# Patient Record
Sex: Female | Born: 1935 | Race: Black or African American | Hispanic: No | Marital: Married | Smoking: Former smoker
Health system: Southern US, Community
[De-identification: ages and names within clinical notes are randomized; demographics above are authoritative.]

## PROBLEM LIST (undated history)

## (undated) DIAGNOSIS — E119 Type 2 diabetes mellitus without complications: Secondary | ICD-10-CM

## (undated) DIAGNOSIS — I2510 Atherosclerotic heart disease of native coronary artery without angina pectoris: Secondary | ICD-10-CM

## (undated) DIAGNOSIS — N189 Chronic kidney disease, unspecified: Secondary | ICD-10-CM

## (undated) DIAGNOSIS — I10 Essential (primary) hypertension: Secondary | ICD-10-CM

## (undated) DIAGNOSIS — M1990 Unspecified osteoarthritis, unspecified site: Secondary | ICD-10-CM

## (undated) DIAGNOSIS — D649 Anemia, unspecified: Secondary | ICD-10-CM

## (undated) DIAGNOSIS — I739 Peripheral vascular disease, unspecified: Secondary | ICD-10-CM

## (undated) DIAGNOSIS — I8390 Asymptomatic varicose veins of unspecified lower extremity: Secondary | ICD-10-CM

## (undated) DIAGNOSIS — I499 Cardiac arrhythmia, unspecified: Secondary | ICD-10-CM

## (undated) DIAGNOSIS — E785 Hyperlipidemia, unspecified: Secondary | ICD-10-CM

## (undated) HISTORY — PX: OTHER SURGICAL HISTORY: SHX169

## (undated) HISTORY — PX: ABDOMINAL HYSTERECTOMY: SHX81

## (undated) HISTORY — PX: APPENDECTOMY: SHX54

---

## 2007-06-10 ENCOUNTER — Ambulatory Visit: Payer: Self-pay | Admitting: Family Medicine

## 2007-06-25 ENCOUNTER — Ambulatory Visit: Payer: Self-pay | Admitting: Family Medicine

## 2007-07-03 ENCOUNTER — Emergency Department: Payer: Self-pay | Admitting: Emergency Medicine

## 2007-07-09 ENCOUNTER — Encounter: Admission: RE | Admit: 2007-07-09 | Discharge: 2007-07-09 | Payer: Self-pay | Admitting: Family Medicine

## 2007-07-13 ENCOUNTER — Encounter: Admission: RE | Admit: 2007-07-13 | Discharge: 2007-07-13 | Payer: Self-pay | Admitting: Family Medicine

## 2007-08-11 ENCOUNTER — Inpatient Hospital Stay (HOSPITAL_COMMUNITY): Admission: EM | Admit: 2007-08-11 | Discharge: 2007-08-13 | Payer: Self-pay | Admitting: Emergency Medicine

## 2007-08-11 ENCOUNTER — Ambulatory Visit: Payer: Self-pay | Admitting: Hospitalist

## 2007-08-18 ENCOUNTER — Encounter: Payer: Self-pay | Admitting: Family Medicine

## 2007-11-25 ENCOUNTER — Ambulatory Visit: Payer: Self-pay | Admitting: Gastroenterology

## 2007-12-24 ENCOUNTER — Ambulatory Visit: Payer: Self-pay

## 2008-03-07 ENCOUNTER — Emergency Department: Payer: Self-pay | Admitting: Emergency Medicine

## 2008-03-07 ENCOUNTER — Other Ambulatory Visit: Payer: Self-pay

## 2010-11-06 ENCOUNTER — Emergency Department (HOSPITAL_COMMUNITY)
Admission: EM | Admit: 2010-11-06 | Discharge: 2010-11-07 | Payer: Self-pay | Source: Home / Self Care | Admitting: Emergency Medicine

## 2011-04-30 NOTE — Discharge Summary (Signed)
NAMECHRISTINIA, [name]             ACCOUNT NO.:  [id number]   MEDICAL RECORD NO.:  [id number]          PATIENT TYPE:  INP   LOCATION:  5735                         FACILITY:  [facility]   PHYSICIAN:  [name], M.D.   DATE OF BIRTH:  09-Jul-1921   DATE OF ADMISSION:  08/11/2007  DATE OF DISCHARGE:                               DISCHARGE SUMMARY   CHIEF COMPLAINT:  Confusion, slurring of words, and decreased p.o.  intake.   DISCHARGE DIAGNOSES:  1. Altered mental status secondary to urinary tract infection and      fentanyl overdose.  2. Nausea, vomiting - resolved.  3. Acute-on-chronic renal insufficiency - resolved.  4. Diabetes.  5. Sciatica.  6. Hypertension.  7. Peptic ulcer disease.  8. 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  9. Status post hysterectomy.  10.Status post appendectomy.   DISCHARGE MEDICATIONS:  1. Tenormin 100 mg daily.  2. Omeprazole 20 mg daily.  3. Lovastatin 20 mg daily.  4. Aricept 5 mg at night.  5. Norvasc 5 mg daily.  6. Fentanyl patch 50 mcg change every 3 days.  7. Glipizide XL 10 mg two times per day with meals.  8. Levaquin 750 mg one daily on August 29 then stop.  9. Metformin 500 mg daily.  10.Aspirin 81 mg daily.  11.Colace 100 mg every 12 hours.   DISPOSITION AND FOLLOWUP:  The patient is a patient of Dr. Eliane Decree at  the [facility] in [place], phone number [phone].  The  patient has a followup appointment set for September 11 at 1 p.m.  At  this followup appointment it will important to continue to monitor the  patient's blood pressure as well as her mental status.  It is hard to  tell what the patient's actual baseline is and appears to have some  baseline dementia.  The patient also has followup with neurology on  August 31, 2007, and the patient should keep this appointment as  scheduled.  This appointment is also to deal with the patient's dementia  as well as her hygroma, although when  seen as an inpatient it was felt  the hygroma was a chronic problem and nothing needed to be done to treat  it.   PROCEDURES:  CT scan head without contrast August 11, 2007, impression:  Right frontal CSF effusion is unchanged, bilateral cerebellar effusions  unchanged, with no acute abnormalities noted.   CULTURES:  1. Urine culture August 11, 2007:  No growth.  2. Blood culture August 11, 2007:  No growth to date x2.   CONSULTS:  August 11, 2007:  Neurology.   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  One daughter, the  patient's primary caregiver, left the patient in one of her sister's  care over the weekend.  The patient was noted  to have increased  somnolence, decreased activity, and have decreased appetite over the  weekend.  The daughter found the patient to be in a drunken-like state 1  day prior to admission with slurring of her words.  The patient also  notes nausea and vomiting over the last 3 days.  The daughter  additionally notes that the patient received double her dose of fentanyl  over the past 3 days.  The patient is on a fentanyl patch of 50 mcg  changed every 3 days and when the primary caregiver came to get her  mother after the weekend noticed that there were two 50-mcg patches on  her.  The patient lives with her daughter who is the primary caregiver,  and review of systems was negative.  Temperature on admission 100.2,  blood pressure 104/43, pulse 58, respiratory rate 20, O2 93% on room  air.  In general, the patient was in no acute distress.  Eyes were  anicteric, extraocular movements intact.  Pupils equal and reactive to  light but pinpoint.  Ear, nose, throat,  oropharynx was clear, neck was  supple.  Respiratory was clear to auscultation bilaterally after  coughing.  Rhonchi were present before coughing bilaterally at the  bases.  Cardiovascular:  Regular rate and rhythm, a split S1; 1/6 to 2/6  systolic ejection murmur best heard at the left upper sternal border.  GI:  Soft, nontender, positive bowel sounds.  Extremities:  No edema.  Skin:  No rashes.  Neurologic exam was 5/5 upper extremity strength and  on neurologic exam cranial nerves II-XII are intact.  Strength was  normal except the patient had difficulty lifting her left leg.  The  patient was oriented to person only and could not give place or time.   ADMISSION LABORATORIES:  A CBC was obtained and showed a white count of  9.4, hemoglobin 14.3, platelets 272.  A CMET with a sodium 137,  potassium 4.9, chloride 102, bicarb 27, BUN 36, creatinine 1.7, and a  glucose of 162.  Bilirubin was 0.7, alk phos 41, AST 71, ALT 15, protein  6.5, albumin 3.5, calcium 9.2.  Point-of-care cardiac enzymes were  negative.  A chest x-ray showed no acute disease.  A urinalysis showed  moderate leukocytes but was negative for nitrites.  Urine micro showed  few bacteria.   HOSPITAL COURSE BY PROBLEM:  #1 - ALTERED MENTAL STATUS.  The patient  presented to [facility] after having 3-4 days of altered mental status  including decreased appetite, slurring of speech, and a decrease in  activity.  The patient had been staying with someone other than her  primary caregiver and at the time her primary caregiver returned over  the weekend to pick her up the patient was found to have double her dose  of fentanyl patches on her.  The fentanyl patch was removed here in the  ED and her mental status slowly began to return to normal.  Due to her  urinalysis findings, the patient was also started on Levaquin to treat a  possible UTI and urine cultures were sent.  Over the course of the  hospitalization the urine  cultures did not end up growing any organisms,  and the Levaquin was dosed x3 days and then stopped.  Throughout the  course of her hospitalization the patient's orientation varied.  The  patient seemed alert and oriented  to person, especially her family, who  was very attentive and present at all times.  At some times during her  hospitalization the patient was oriented to place and could state that  she was in [name] and knew that [name] was [name].  She  also knew that her home was in [name], but said that the year was  1968 or refused to answer the question altogether.  The patient had some  baseline dementia and according to the primary care [name], this has  been a slow decline over the past year.  The patient was on Aricept when  she came in.  The other concern of note was that the patient had a right  effusion found on head CT and we were worried that this was a subdural  hematoma versus a hygroma versus normal-pressure hydrocephalus.  Neurology was consulted and saw the patient.  They felt that there were  no signs of normal-pressure hydrocephalus and this was likely a chronic  hygroma.  Therefore, no treatment was necessary.  Neurology agreed that  her altered mental status was likely due to fentanyl overdose and a  urinary tract infection.  The patient has a neurology followup on  September 03, 2007, to try and help with her altered mental status.   Due to some mild wheezing on the day of discharge, a chest x-ray was  obtained which showed no acute disease.  Therefore, it was unlikely the  patient had suffered from any kind of pneumonia.  The patient remained  afebrile throughout the course of her hospitalization and temperature of  99.6 at discharge.   To further work up reversible causes of her altered mental status we  also checked TSH and B12 levels which were both normal.   #2 - URINARY TRACT INFECTION.  Initially the patient's urinalysis showed   moderate leukocytes and small bacteria, so the patient was started on  Levaquin.  She was treated with 3 days, and urine cultures were  negative.  This could be the cause of the patient's altered mental  status which resolved over her hospitalization.   #3 - ACUTE RENAL INSUFFICIENCY.  Initially the patient presented with a  creatinine of 1.7.  The patient was hydrated IV and on the day of  discharge her creatinine had decreased to 1.02.  Therefore, it was  likely prerenal and secondary to dehydration.   #4 - DEMENTIA.  The patient's baseline dementia per her primary  caregiver has been a precipitous decline over the past year.  The  patient's orientation ranged from oriented to self and family only to  oriented to place and somewhat time.  The patient was continued on her  Aricept and the patient will follow up with neurology to assess this  problem further.   #5 - DIABETES.  The patient's blood sugar on admission was 162.  She was  continued on her glipizide and her metformin was held secondary to her  renal insufficiency and she was placed on sliding scale insulin.  Once  her creatinine returned to normal, the metformin was restarted at her  home dose and her blood sugars ranged from 137 to 237 on the day of  discharge.   #6 - HYPERTENSION.  The patient's blood pressure on the day of discharge  ranged from 100 to 140.  She was continued on her home medications  without changes and orthostatic blood pressures were obtained which were  all negative.   #7 - DECONDITIONING.  Due to the patient's altered mental status and  likely baseline dementia, she is somewhat  deconditioned.  Physical  therapy and occupational therapy were consulted to assess the patient's  needs.  They felt that there were no equipment needs identified but that  the patient would benefit from 24-hour supervision.  The family was  present in the room and observed this session and felt they could  provide 24-hour  supervision at home.  Home health PT and home health  occupational therapy were also recommended and this was set up for the  patient at the time of discharge.   DISCHARGE LABORATORY AND VITAL SIGNS:  Pulse 60, respiratory rate 20,  temperature 97.9, blood pressure 116/65, saturating 97% on room air.  A  CBC was not obtained at the day of discharge but a BMET was.  It showed  a sodium of 138, potassium 5.2, chloride 103, bicarb 30, BUN 24,  creatinine 1.02, and glucose of 55.      Michelle Ruiz, [name]  Electronically Signed      [name], M.D.  Electronically Signed    JP/MEDQ  D:  08/13/2007  T:  08/13/2007  Job:  [id number]   cc:   Hillery Aldo, [name]

## 2011-04-30 NOTE — Consult Note (Signed)
NAMELOVELLE, DEITRICK NO.:  [id number]   MEDICAL RECORD NO.:  [id number]          PATIENT TYPE:  INP   LOCATION:  5735                         FACILITY:  [facility]   PHYSICIAN:  Melvyn Novas, M.D.  DATE OF BIRTH:  Jan 30, 1921   DATE OF CONSULTATION:  08/12/2007  DATE OF DISCHARGE:                                 CONSULTATION   The patient is an 77 year old African American right-handed female.  The  neurology consult is requested for acute mental status changes that seem  to have peaked yesterday.   I have, upon review of the chart, learned that the patient has a  preexisting dementia, was in June evaluated by MRI of the brain which  documented a possible hygroma.  At that time the ER ordered the MRI, and  she had a CT of the head without contrast as of yesterday evening which  shows fairly identical findings.    There is no interval change.  The patient presented with confusion, word-finding difficulties and  hallucinations.   It turned out that she took two fentanyl patches 50 mcg each instead of  one.  In addition, she was found to suffer from a UTI or pyelonephritis  and this could also have worsened her underlying impairment by a mild  dementia.   The patient herself can give me the following information about her  history of present illness:  I was sick to my stomach and I had a bad  cough.  Upon request if she felt febrile, she denied.  If she felt that  she had burning pain with urination, she denied.  She does state she had  lower back pain and flank pain.  The patient had, according to her  family, a mental status decline that has been progressive over the last  year.  She had been placed on Aricept by her primary care physician.  Again her review of systems she cannot to herself contribute a whole lot  but states that she had no appetite, was not thirsty, felt sick to her  stomach, and had pain in her right flank.   PAST MEDICAL HISTORY:  Positive  for diabetes, underlying dementia,  sciatica, osteoporosis.  There is a report of chronic renal failure,  hypertension and anemia.   SOCIAL HISTORY:  The patient lives with her youngest daughter.   MEDICATIONS:  Listed in the patient's H&P provided by Dr. Okey Dupre and  Dr. Lorel Monaco as:  1. Atenolol 100 mg p.o.  2. Darvocet-N 50 p.r.n.  3. Glipizide 10 mg b.i.d.  4. Aricept she is only on 5 mg q.h.s. - I am not sure if this is a      dose that is intended to be increased or if the patient could not      tolerate an increase previously.  Aricept can be given 5 mg b.i.d.      should the nausea component be the trouble.  5. Amlodipine 5 mg p.o. daily  6. Omeprazole 20 mg at night.  7. Metformin 500 mg a day.  There is no time of day given for the  medication.  8. Lovastatin 20 mg daily.  I suppose this is a medication at dinner.  9. Fentanyl patch every 3 days.   The patient had a normal white blood cell count 9.4, platelet count 272,  H&H was 14.3/42.2.  Her sodium was 137, her potassium 4.9, her glucose  162.  She had normal GOT and GPT, protein levels, albumin level.  Calcium was 9.3.   PHYSICAL EXAMINATION:  Upon review of the CT and the laboratory results,  I discussed with the patient what brought her to the hospital.  She is  alert and oriented.  She can name the date, place, time and knows all  her children and grandchildren by name.  She did, however, not recall  who visited her this morning when her daughter asked her.  Her blood  pressure is 118/80, her respiratory rate is 18.  She has rhonchi.  There  is no cardiac murmur.  No carotid bruit.  There is no peripheral pulse  loss.  No edema.  No clubbing, cyanosis.  The patient has right-sided  flank pain but palpation of the pelvis and abdominal quadrants does not  show any pain.  Mental status examination shows no evidence of apraxia,  dysarthria, or aphagia.  Again, the patient seems to have mostly short-  term memory  deficits.  She could follow right and left instructions  without difficulty.  She was prompt in following motor commands.  Cranial nerves show bilateral equal pupils.  The patient has a very  small pupils that do not allow a ophthalmoscopic examination, likely due  to the fentanyl.  Full visual extraocular movements are preserved.  There is no facial asymmetry.  Tongue and uvula are midline.  The  patient can close her eyes tightly, can grin and shows symmetric  features.  She does not have any sensory loss over the face and denies  any sensory loss over the body.  On a finger-nose test she did well  without tremor, ataxia or dysmetria.  Deep tendon reflexes are  attenuated.  The patient could lift her legs against gravity and can do  the same with her arms without a pronator drift.  A gait examination was  deferred.  Bilateral stimulation of the plantar pedis showed downgoing  toes.   ASSESSMENT:  This is a nonfocal neurologic exam under the assumption  that the patient had a preexisting mild dementia.  I suspect that her  UTI that was reported in the chart might be causing her flank pain.  She  could also have a pyelonephritis.  She did not have an elevated white  blood cell count so her transient delirium is most likely attributable  to the fentanyl and is superimposed on the dementia.  She is already  much improved since the second fentanyl patch was removed yesterday.  There is no evidence of stroke.  There is moderate atrophy by CT and MRI  and the chronic hygroma was questioned on the right parietal lobe.  There is no feature consistent with a normal-pressure hydrocephalus  seen.   I would recommend:   PLAN:  To continue the Aricept and give the patient a baby aspirin if  tolerated.  I would also like her during hospitalization to keep a tight  intake and output control.  I would straight-catheterize the patient for  one clean urine sample for cultures, and if the culture is  positive and  she indeed required antibiotic coverage, I would prefer to put her  on  Bactrim double-strength as she has no sulfur allergies, rather than on  Cipro IV which does cause occasional delirium and confusion in an  elderly individual.   I thank Dr. Okey Dupre for this consultation    The patient is supposed to have an appointment made for early September  with one of my colleagues.      Melvyn Novas, M.D.  Electronically Signed     CD/MEDQ  D:  08/12/2007  T:  08/13/2007  Job:  [id number]

## 2011-09-27 LAB — CBC
HCT: 37.7
HCT: 42.2
Hemoglobin: 12.9
Hemoglobin: 14.3
MCHC: 33.8
MCV: 93.5
MCV: 94.7
Platelets: 239
Platelets: 272
RBC: 4.46
RDW: 13
RDW: 13
WBC: 9.4

## 2011-09-27 LAB — BASIC METABOLIC PANEL
BUN: 24 — ABNORMAL HIGH
BUN: 32 — ABNORMAL HIGH
CO2: 30
Chloride: 105
GFR calc non Af Amer: 43 — ABNORMAL LOW
Glucose, Bld: 152 — ABNORMAL HIGH
Glucose, Bld: 155 — ABNORMAL HIGH
Potassium: 4.2
Potassium: 5.2 — ABNORMAL HIGH
Sodium: 138
Sodium: 139

## 2011-09-27 LAB — URINE MICROSCOPIC-ADD ON

## 2011-09-27 LAB — CULTURE, BLOOD (ROUTINE X 2)
Culture: NO GROWTH
Culture: NO GROWTH

## 2011-09-27 LAB — URINALYSIS, ROUTINE W REFLEX MICROSCOPIC
Glucose, UA: NEGATIVE
Ketones, ur: 15 — AB
Nitrite: NEGATIVE
Protein, ur: 100 — AB
Specific Gravity, Urine: 1.022
Urobilinogen, UA: 1
pH: 5

## 2011-09-27 LAB — POCT CARDIAC MARKERS
Operator id: 257131
Troponin i, poc: <0.05

## 2011-09-27 LAB — DIFFERENTIAL
Basophils Absolute: 0
Basophils Absolute: 0
Basophils Relative: 0
Eosinophils Absolute: 0.2
Eosinophils Absolute: 0.2
Eosinophils Relative: 2
Eosinophils Relative: 3
Lymphocytes Relative: 15
Lymphocytes Relative: 18
Lymphs Abs: 1.4
Lymphs Abs: 1.6
Monocytes Absolute: 0.8 — ABNORMAL HIGH
Monocytes Absolute: 0.9 — ABNORMAL HIGH
Monocytes Relative: 8
Neutro Abs: 7
Neutrophils Relative %: 74

## 2011-09-27 LAB — COMPREHENSIVE METABOLIC PANEL
AST: 21
Albumin: 3.5
BUN: 36 — ABNORMAL HIGH
CO2: 27
Calcium: 9.2
Creatinine, Ser: 1.7 — ABNORMAL HIGH
GFR calc Af Amer: 35 — ABNORMAL LOW
GFR calc non Af Amer: 29 — ABNORMAL LOW

## 2011-09-27 LAB — CK: Total CK: 74

## 2011-09-27 LAB — URINE CULTURE
Colony Count: NO GROWTH
Culture: NO GROWTH

## 2011-09-27 LAB — COMPREHENSIVE METABOLIC PANEL WITH GFR
ALT: 15
Alkaline Phosphatase: 41
Chloride: 102
Glucose, Bld: 162 — ABNORMAL HIGH
Potassium: 4.9
Sodium: 137
Total Bilirubin: 0.7
Total Protein: 6.5

## 2011-09-27 LAB — VITAMIN B12: Vitamin B-12: 386 (ref 211–911)

## 2015-07-13 ENCOUNTER — Encounter
Admission: RE | Disposition: A | Discharge status: Home / Self Care | Payer: Self-pay | Source: Ambulatory Visit | Attending: Vascular Surgery

## 2015-07-13 ENCOUNTER — Ambulatory Visit
Admission: RE | Admit: 2015-07-13 | Discharge: 2015-07-13 | Disposition: A | Discharge status: Home / Self Care | Payer: Medicare (Managed Care) | Source: Ambulatory Visit | Attending: Vascular Surgery | Admitting: Vascular Surgery

## 2015-07-13 ENCOUNTER — Encounter: Payer: Self-pay | Admitting: *Deleted

## 2015-07-13 DIAGNOSIS — Z79899 Other long term (current) drug therapy: Secondary | ICD-10-CM | POA: Diagnosis not present

## 2015-07-13 DIAGNOSIS — I499 Cardiac arrhythmia, unspecified: Secondary | ICD-10-CM | POA: Diagnosis not present

## 2015-07-13 DIAGNOSIS — E11621 Type 2 diabetes mellitus with foot ulcer: Secondary | ICD-10-CM | POA: Insufficient documentation

## 2015-07-13 DIAGNOSIS — I519 Heart disease, unspecified: Secondary | ICD-10-CM | POA: Diagnosis not present

## 2015-07-13 DIAGNOSIS — N289 Disorder of kidney and ureter, unspecified: Secondary | ICD-10-CM | POA: Diagnosis not present

## 2015-07-13 DIAGNOSIS — E785 Hyperlipidemia, unspecified: Secondary | ICD-10-CM | POA: Diagnosis not present

## 2015-07-13 DIAGNOSIS — I8390 Asymptomatic varicose veins of unspecified lower extremity: Secondary | ICD-10-CM | POA: Insufficient documentation

## 2015-07-13 DIAGNOSIS — L97429 Non-pressure chronic ulcer of left heel and midfoot with unspecified severity: Secondary | ICD-10-CM | POA: Diagnosis not present

## 2015-07-13 DIAGNOSIS — I70244 Atherosclerosis of native arteries of left leg with ulceration of heel and midfoot: Secondary | ICD-10-CM | POA: Insufficient documentation

## 2015-07-13 DIAGNOSIS — I10 Essential (primary) hypertension: Secondary | ICD-10-CM | POA: Insufficient documentation

## 2015-07-13 HISTORY — DX: Essential (primary) hypertension: I10

## 2015-07-13 HISTORY — DX: Atherosclerotic heart disease of native coronary artery without angina pectoris: I25.10

## 2015-07-13 HISTORY — DX: Hyperlipidemia, unspecified: E78.5

## 2015-07-13 HISTORY — DX: Asymptomatic varicose veins of unspecified lower extremity: I83.90

## 2015-07-13 HISTORY — DX: Type 2 diabetes mellitus without complications: E11.9

## 2015-07-13 HISTORY — DX: Chronic kidney disease, unspecified: N18.9

## 2015-07-13 HISTORY — PX: PERIPHERAL VASCULAR CATHETERIZATION: SHX172C

## 2015-07-13 HISTORY — DX: Cardiac arrhythmia, unspecified: I49.9

## 2015-07-13 LAB — BASIC METABOLIC PANEL
Anion gap: 8 (ref 5–15)
BUN: 19 mg/dL (ref 6–20)
CALCIUM: 8.6 mg/dL — AB (ref 8.9–10.3)
CHLORIDE: 101 mmol/L (ref 101–111)
CO2: 32 mmol/L (ref 22–32)
CREATININE: 1.01 mg/dL — AB (ref 0.44–1.00)
GFR, EST AFRICAN AMERICAN: 54 mL/min — AB (ref >60)
GFR, EST NON AFRICAN AMERICAN: 47 mL/min — AB (ref >60)
Glucose, Bld: 135 mg/dL — ABNORMAL HIGH (ref 65–99)
Potassium: 3.6 mmol/L (ref 3.5–5.1)
SODIUM: 141 mmol/L (ref 135–145)

## 2015-07-13 SURGERY — LOWER EXTREMITY ANGIOGRAPHY
Anesthesia: Moderate Sedation | Laterality: Left

## 2015-07-13 MED ORDER — LIDOCAINE-EPINEPHRINE (PF) 1 %-1:200000 IJ SOLN
INTRAMUSCULAR | Status: AC
  Filled 2015-07-13: qty 30

## 2015-07-13 MED ORDER — IOHEXOL 300 MG/ML  SOLN
INTRAMUSCULAR | Status: DC | PRN
  Administered 2015-07-13: 120 mL via INTRA_ARTERIAL

## 2015-07-13 MED ORDER — FENTANYL CITRATE (PF) 100 MCG/2ML IJ SOLN
INTRAMUSCULAR | Status: DC | PRN
  Administered 2015-07-13 (×3): 25 ug via INTRAVENOUS

## 2015-07-13 MED ORDER — HYDROMORPHONE HCL 1 MG/ML IJ SOLN
0.5000 mg | INTRAMUSCULAR | Status: DC | PRN
Start: 2015-07-13 — End: 2015-07-13

## 2015-07-13 MED ORDER — LABETALOL HCL 5 MG/ML IV SOLN: 10.0000 mg | INTRAVENOUS | Status: DC | PRN

## 2015-07-13 MED ORDER — HEPARIN SODIUM (PORCINE) 1000 UNIT/ML IJ SOLN
INTRAMUSCULAR | Status: AC
  Filled 2015-07-13: qty 1

## 2015-07-13 MED ORDER — DIPHENHYDRAMINE HCL 50 MG/ML IJ SOLN
INTRAMUSCULAR | Status: DC | PRN
  Administered 2015-07-13: 50 mg via INTRAVENOUS

## 2015-07-13 MED ORDER — CEFAZOLIN SODIUM 1-5 GM-% IV SOLN: 1.0000 g | Freq: Once | INTRAVENOUS | Status: DC

## 2015-07-13 MED ORDER — LABETALOL HCL 5 MG/ML IV SOLN
INTRAVENOUS | Status: DC | PRN
  Administered 2015-07-13: 20 mg via INTRAVENOUS

## 2015-07-13 MED ORDER — ONDANSETRON HCL 4 MG/2ML IJ SOLN: 4.0000 mg | Freq: Four times a day (QID) | INTRAMUSCULAR | Status: DC | PRN

## 2015-07-13 MED ORDER — HEPARIN (PORCINE) IN NACL 2-0.9 UNIT/ML-% IJ SOLN
INTRAMUSCULAR | Status: AC
  Filled 2015-07-13: qty 1000

## 2015-07-13 MED ORDER — OXYCODONE-ACETAMINOPHEN 5-325 MG PO TABS: 1.0000 | ORAL_TABLET | ORAL | Status: DC | PRN

## 2015-07-13 MED ORDER — DIPHENHYDRAMINE HCL 50 MG/ML IJ SOLN
INTRAMUSCULAR | Status: AC
  Filled 2015-07-13: qty 1

## 2015-07-13 MED ORDER — SODIUM CHLORIDE 0.9 % IV SOLN
INTRAVENOUS | Status: DC
  Administered 2015-07-13: 11:00:00 via INTRAVENOUS

## 2015-07-13 MED ORDER — MIDAZOLAM HCL 5 MG/5ML IJ SOLN
INTRAMUSCULAR | Status: AC
  Filled 2015-07-13: qty 5

## 2015-07-13 MED ORDER — GUAIFENESIN-DM 100-10 MG/5ML PO SYRP: 15.0000 mL | ORAL_SOLUTION | ORAL | Status: DC | PRN

## 2015-07-13 MED ORDER — PHENOL 1.4 % MT LIQD: 1.0000 | OROMUCOSAL | Status: DC | PRN

## 2015-07-13 MED ORDER — ACETAMINOPHEN 325 MG RE SUPP
325.0000 mg | RECTAL | Status: DC | PRN
  Filled 2015-07-13: qty 2

## 2015-07-13 MED ORDER — LABETALOL HCL 5 MG/ML IV SOLN
INTRAVENOUS | Status: AC
  Filled 2015-07-13: qty 4

## 2015-07-13 MED ORDER — HEPARIN SODIUM (PORCINE) 1000 UNIT/ML IJ SOLN
INTRAMUSCULAR | Status: DC | PRN
  Administered 2015-07-13: 4000 [IU] via INTRAVENOUS

## 2015-07-13 MED ORDER — CEFAZOLIN SODIUM 1-5 GM-% IV SOLN
INTRAVENOUS | Status: AC
  Filled 2015-07-13: qty 50

## 2015-07-13 MED ORDER — FENTANYL CITRATE (PF) 100 MCG/2ML IJ SOLN
INTRAMUSCULAR | Status: AC
  Filled 2015-07-13: qty 2

## 2015-07-13 MED ORDER — HYDRALAZINE HCL 20 MG/ML IJ SOLN: 5.0000 mg | INTRAMUSCULAR | Status: DC | PRN

## 2015-07-13 MED ORDER — METOPROLOL TARTRATE 1 MG/ML IV SOLN: 2.0000 mg | INTRAVENOUS | Status: DC | PRN

## 2015-07-13 MED ORDER — ACETAMINOPHEN 325 MG PO TABS: 325.0000 mg | ORAL_TABLET | ORAL | Status: DC | PRN

## 2015-07-13 MED ORDER — MIDAZOLAM HCL 2 MG/2ML IJ SOLN
INTRAMUSCULAR | Status: DC | PRN
  Administered 2015-07-13 (×3): 1 mg via INTRAVENOUS

## 2015-07-13 SURGICAL SUPPLY — 27 items
BALLN DORADO 5X200X135 (BALLOONS) ×4
BALLN LUTONIX 5X150X130 (BALLOONS) ×8
BALLN LUTONIX DCB 4X60X130 (BALLOONS) ×4
BALLN LUTONIX DCB 6X60X130 (BALLOONS) ×4
BALLN ULTRVRSE 4X300X150 (BALLOONS) ×4
BALLOON DORADO 5X200X135 (BALLOONS) ×2 IMPLANT
BALLOON LUTONIX 5X150X130 (BALLOONS) ×4 IMPLANT
BALLOON LUTONIX DCB 4X60X130 (BALLOONS) ×2 IMPLANT
BALLOON LUTONIX DCB 6X60X130 (BALLOONS) ×2 IMPLANT
BALLOON ULTRVRSE 4X300X150 (BALLOONS) ×2 IMPLANT
CANNULA 5F STIFF (CANNULA) ×4 IMPLANT
CATH KMP 5.0X100 (CATHETERS) ×4 IMPLANT
CATH KUMPE (CATHETERS) ×2
CATH ROYAL FLUSH PIG 5F 70CM (CATHETERS) ×4 IMPLANT
CATH SLIP 5FR 0.38 X 40 KMP (CATHETERS) ×2 IMPLANT
DEVICE PRESTO INFLATION (MISCELLANEOUS) ×4 IMPLANT
DEVICE SAFEGUARD 24CM (GAUZE/BANDAGES/DRESSINGS) ×4 IMPLANT
DEVICE STARCLOSE SE CLOSURE (Vascular Products) ×4 IMPLANT
GLIDEWIRE ADV .035X260CM (WIRE) ×4 IMPLANT
PACK ANGIOGRAPHY (CUSTOM PROCEDURE TRAY) ×4 IMPLANT
SHEATH ANL2 6FRX45 HC (SHEATH) ×4 IMPLANT
SHEATH BRITE TIP 5FRX11 (SHEATH) ×4 IMPLANT
STENT VIABAHN 5X250X120 (Permanent Stent) ×4 IMPLANT
SYR MEDRAD MARK V 150ML (SYRINGE) ×4 IMPLANT
TUBING CONTRAST HIGH PRESS 72 (TUBING) ×4 IMPLANT
WIRE G V18X300CM (WIRE) ×4 IMPLANT
WIRE J 3MM .035X145CM (WIRE) ×4 IMPLANT

## 2015-07-13 NOTE — Discharge Instructions (Signed)

## 2015-07-13 NOTE — CV Procedure (Signed)
[facility] Percutaneous Study/Intervention Procedural Note   Date of Surgery: 07/13/2015  Surgeon(s):Kyisha Fowle   Assistants:none  Pre-operative Diagnosis: PAD with ulceration LLE  Post-operative diagnosis: Same  Procedure(s) Performed: 1. Ultrasound guidance for vascular access right femoral artery 2. Catheter placement into left profunda femorus artery and left popliteal artery 3. Aortogram and selective left lower extremity angiogram 4. Percutaneous transluminal angioplasty of left EIA  And left CFA with 67m diameter drug coated angioplasty balloon 5. Percutaneous transluminal angioplasty of left profunda femoris artery with 4 mm diameter drug-coated angioplasty balloon  6.  Percutaneous transluminal angioplasty of left popliteal artery with 4 mm diameter conventional and 5 mm diameter drug-coated angioplasty balloon  7.  Percutaneous transluminal angioplasty of left superficial femoral artery with 4 mm diameter conventional and 5 mm diameter drug-coated angioplasty balloon proximally  8.  Viabahn covered stent placement to the left superficial femoral artery for greater than 50% residual stenosis and dissection after angioplasty using a 5 mm diameter by 25 cm length stent 9. StarClose closure device right femoral artery  EBL: 25 cc  Indications: Patient is a 81year old African-American female with a nonhealing ulceration of the left heel and with peripheral vascular disease. She has previously undergone bypass in the right lower extremity. The patient is brought in for angiography for further evaluation and potential treatment. Risks and benefits are discussed and informed consent is obtained  Procedure: The patient was identified and appropriate procedural time out was performed. The patient was then placed supine on the table and prepped and draped in the usual  sterile fashion. Ultrasound was used to evaluate the right common femoral artery. It was patent . A digital ultrasound image was acquired. A Seldinger needle was used to access the right common femoral artery under direct ultrasound guidance and a permanent image was performed. A 0.035 J wire was advanced without resistance and a 5Fr sheath was placed. Pigtail catheter was placed into the aorta and an AP aortogram was performed. This demonstrated normal renal arteries and normal aorta without significant stenosis. There was a patent right iliac stent present. There was stenosis in the distal left external iliac artery down into the common femoral artery that was significant in the 80-90% range.  I then crossed the aortic bifurcation and advanced to the left femoral head. Selective left lower extremity angiogram was then performed. This demonstrated extensive multilevel disease. She had about an 80-90% stenosis in the distal left external iliac artery and proximal common femoral artery. The SFA was occluded proximally and had severe stenosis and occlusion throughout multiple segments. The profunda femoris artery had a greater than 90% stenosis about 2 cm beyond its origin. The popliteal artery was also diffusely diseased with multiple areas of greater than 70% stenosis. The anterior tibial artery was actually patent and was the only tibial vessel to the foot. The patient will need extensive treatment to help increase her perfusion in hopes of limb salvage. The patient was systemically heparinized and a 6 [place] sheath was then placed over the [employer]. I then used a Kumpe catheter and the advantage wire to navigate into the profunda femoris artery. With her diffusely diseased SFA and popliteal artery, I was leery that I would be able to get the SFA and popliteal arteries open. I felt it was important to treat the profunda femoris artery to improve her collateral blood flow secondary to her  extensive SFA and popliteal disease, even though this significantly complicated procedure with the  additional cannulation and treatment adding difficulty and time to the procedure. I then treated the profunda femoris artery with a 4 mm diameter by 6 cm length drug-coated angioplasty balloon inflated to 10 atm for 1 minute. I then used a 6 mm diameter by 6 cm length drug-coated angioplasty balloon to treat the distal external iliac artery and proximal common femoral artery stenosis this was inflated to 12 atm for 1 minute. Angiogram following these inflations demonstrated about a 30% residual stenosis in the common femoral artery which was not flow limiting, a 25-30% stenosis in the distal external iliac artery which was not flow limiting, and a less than 10% residual stenosis in the profunda femoris artery. I then turned my attention to the superficial femoral artery. I was able to navigate through the multiple areas of stenosis and occlusion with a Kumpe catheter and a Terumo advantage wire later exchanging for a 0.018 wire. I confirmed intraluminal flow in the popliteal artery. This was a dense calcific lesion and I started by treating from the popliteal artery at the knee and the entire superficial femoral artery with a 4 mm diameter angioplasty balloon. This was slightly undersized, but I used a 0.018 angioplasty balloon as this is all that wouldn't track to begin with. After the initial inflation, multiple areas of residual stenosis were seen. I treated the popliteal lesion with a 5 mm diameter by 15 cm length drug-coated angioplasty balloon. This resulted in a 20-25% residual stenosis at worst around the knee and maintained flow distally. I treated the proximal and mid SFA with a 5 mm diameter drug-coated angioplasty balloon inflated to 12 atm for 1 minute. There remains significant residual stenosis in the proximal and mid superficial femoral artery after angioplasty, so I elected to treat with a 5 mm  diameter by 25 cm length Viabahn covered stent. This was deployed approximately 1-2 cm from the origin of the SFA to make sure not to encroach on the profunda femoris artery. This was postdilated with a 5 mm diameter high pressure balloon. There was no significant residual stenosis in the SFA following stent placement with brisk flow distally and at this point I felt we had improved her perfusion is much as possible. I elected to terminate the procedure. The sheath was removed and StarClose closure device was deployed in the left femoral artery with excellent hemostatic result. The patient was taken to the recovery room in stable condition having tolerated the procedure well.  Findings:  Aortogram: No aortic stenosis. Renal arteries patent. Significant left external iliac artery stenosis improved with angioplasty Left Lower Extremity: Extensive multilevel left lower extremity disease improved with intervention   Disposition: Patient was taken to the recovery room in stable condition having tolerated the procedure well.  Complications: None  Michoel Kunin 07/13/2015 3:01 PM

## 2015-07-13 NOTE — H&P (Signed)
[facility] History & Physical Update  The patient was interviewed and re-examined.  The patient's previous History and Physical has been reviewed and is unchanged.  There is no change in the plan of care. We plan to proceed with the scheduled procedure.  Kama Cammarano, MD  07/13/2015, 10:55 AM

## 2015-07-13 NOTE — OR Nursing (Signed)
starclose by DR Wyn Quaker, site managed by T [place]

## 2015-07-14 ENCOUNTER — Encounter: Payer: Self-pay | Admitting: Vascular Surgery

## 2015-07-24 ENCOUNTER — Ambulatory Visit
Admission: RE | Admit: 2015-07-24 | Discharge: 2015-07-24 | Disposition: A | Discharge status: Home / Self Care | Payer: Medicare (Managed Care) | Source: Ambulatory Visit | Attending: Vascular Surgery | Admitting: Vascular Surgery

## 2015-07-24 ENCOUNTER — Encounter
Admission: RE | Disposition: A | Discharge status: Home / Self Care | Payer: Self-pay | Source: Ambulatory Visit | Attending: Vascular Surgery

## 2015-07-24 DIAGNOSIS — Z79899 Other long term (current) drug therapy: Secondary | ICD-10-CM | POA: Diagnosis not present

## 2015-07-24 DIAGNOSIS — L97429 Non-pressure chronic ulcer of left heel and midfoot with unspecified severity: Secondary | ICD-10-CM | POA: Diagnosis not present

## 2015-07-24 DIAGNOSIS — E785 Hyperlipidemia, unspecified: Secondary | ICD-10-CM | POA: Insufficient documentation

## 2015-07-24 DIAGNOSIS — E119 Type 2 diabetes mellitus without complications: Secondary | ICD-10-CM | POA: Diagnosis not present

## 2015-07-24 DIAGNOSIS — N289 Disorder of kidney and ureter, unspecified: Secondary | ICD-10-CM | POA: Insufficient documentation

## 2015-07-24 DIAGNOSIS — I8390 Asymptomatic varicose veins of unspecified lower extremity: Secondary | ICD-10-CM | POA: Diagnosis not present

## 2015-07-24 DIAGNOSIS — I129 Hypertensive chronic kidney disease with stage 1 through stage 4 chronic kidney disease, or unspecified chronic kidney disease: Secondary | ICD-10-CM | POA: Insufficient documentation

## 2015-07-24 DIAGNOSIS — I499 Cardiac arrhythmia, unspecified: Secondary | ICD-10-CM | POA: Insufficient documentation

## 2015-07-24 DIAGNOSIS — I70244 Atherosclerosis of native arteries of left leg with ulceration of heel and midfoot: Secondary | ICD-10-CM | POA: Diagnosis not present

## 2015-07-24 DIAGNOSIS — I519 Heart disease, unspecified: Secondary | ICD-10-CM | POA: Insufficient documentation

## 2015-07-24 HISTORY — PX: PERIPHERAL VASCULAR CATHETERIZATION: SHX172C

## 2015-07-24 SURGERY — LOWER EXTREMITY ANGIOGRAPHY
Anesthesia: Moderate Sedation | Laterality: Left

## 2015-07-24 MED ORDER — METOPROLOL TARTRATE 1 MG/ML IV SOLN: 2.0000 mg | INTRAVENOUS | Status: DC | PRN

## 2015-07-24 MED ORDER — ALTEPLASE 2 MG IJ SOLR
INTRAMUSCULAR | Status: DC | PRN
  Administered 2015-07-24: 4 mg

## 2015-07-24 MED ORDER — ACETAMINOPHEN 325 MG PO TABS: 325.0000 mg | ORAL_TABLET | ORAL | Status: DC | PRN

## 2015-07-24 MED ORDER — CEFAZOLIN SODIUM 1-5 GM-% IV SOLN: 1.0000 g | Freq: Once | INTRAVENOUS | Status: DC

## 2015-07-24 MED ORDER — MIDAZOLAM HCL 5 MG/5ML IJ SOLN
INTRAMUSCULAR | Status: AC
  Filled 2015-07-24: qty 5

## 2015-07-24 MED ORDER — FENTANYL CITRATE (PF) 100 MCG/2ML IJ SOLN
INTRAMUSCULAR | Status: DC | PRN
  Administered 2015-07-24 (×6): 25 ug via INTRAVENOUS

## 2015-07-24 MED ORDER — SODIUM CHLORIDE 0.9 % IV SOLN
INTRAVENOUS | Status: DC
  Administered 2015-07-24: 14:00:00 via INTRAVENOUS

## 2015-07-24 MED ORDER — HYDRALAZINE HCL 20 MG/ML IJ SOLN: 5.0000 mg | INTRAMUSCULAR | Status: DC | PRN

## 2015-07-24 MED ORDER — HEPARIN SODIUM (PORCINE) 1000 UNIT/ML IJ SOLN
INTRAMUSCULAR | Status: DC | PRN
  Administered 2015-07-24: 2000 [IU] via INTRAVENOUS
  Administered 2015-07-24: 4000 [IU] via INTRAVENOUS

## 2015-07-24 MED ORDER — LABETALOL HCL 5 MG/ML IV SOLN
INTRAVENOUS | Status: AC
  Filled 2015-07-24: qty 4

## 2015-07-24 MED ORDER — HYDRALAZINE HCL 20 MG/ML IJ SOLN
INTRAMUSCULAR | Status: AC
  Filled 2015-07-24: qty 1

## 2015-07-24 MED ORDER — DIPHENHYDRAMINE HCL 50 MG/ML IJ SOLN
INTRAMUSCULAR | Status: AC
  Filled 2015-07-24: qty 1

## 2015-07-24 MED ORDER — ONDANSETRON HCL 4 MG/2ML IJ SOLN: 4.0000 mg | Freq: Four times a day (QID) | INTRAMUSCULAR | Status: DC | PRN

## 2015-07-24 MED ORDER — ACETAMINOPHEN 325 MG RE SUPP: 325.0000 mg | RECTAL | Status: DC | PRN

## 2015-07-24 MED ORDER — SODIUM CHLORIDE 0.9 % IV SOLN: 500.0000 mL | Freq: Once | INTRAVENOUS | Status: DC | PRN

## 2015-07-24 MED ORDER — OXYCODONE-ACETAMINOPHEN 5-325 MG PO TABS: 1.0000 | ORAL_TABLET | ORAL | Status: DC | PRN

## 2015-07-24 MED ORDER — IOHEXOL 300 MG/ML  SOLN
INTRAMUSCULAR | Status: DC | PRN
  Administered 2015-07-24: 120 mL via INTRAVENOUS

## 2015-07-24 MED ORDER — LIDOCAINE-EPINEPHRINE (PF) 1 %-1:200000 IJ SOLN
INTRAMUSCULAR | Status: AC
  Filled 2015-07-24: qty 30

## 2015-07-24 MED ORDER — HEPARIN (PORCINE) IN NACL 2-0.9 UNIT/ML-% IJ SOLN
INTRAMUSCULAR | Status: AC
  Filled 2015-07-24: qty 1000

## 2015-07-24 MED ORDER — HEPARIN SODIUM (PORCINE) 1000 UNIT/ML IJ SOLN
INTRAMUSCULAR | Status: AC
  Filled 2015-07-24: qty 1

## 2015-07-24 MED ORDER — HYDROMORPHONE HCL 1 MG/ML IJ SOLN: 0.5000 mg | INTRAMUSCULAR | Status: DC | PRN

## 2015-07-24 MED ORDER — GUAIFENESIN-DM 100-10 MG/5ML PO SYRP: 15.0000 mL | ORAL_SOLUTION | ORAL | Status: DC | PRN

## 2015-07-24 MED ORDER — LABETALOL HCL 5 MG/ML IV SOLN
INTRAVENOUS | Status: DC | PRN
  Administered 2015-07-24 (×2): 20 mg via INTRAVENOUS

## 2015-07-24 MED ORDER — PHENOL 1.4 % MT LIQD: 1.0000 | OROMUCOSAL | Status: DC | PRN

## 2015-07-24 MED ORDER — LABETALOL HCL 5 MG/ML IV SOLN: 10.0000 mg | INTRAVENOUS | Status: DC | PRN

## 2015-07-24 MED ORDER — HYDRALAZINE HCL 20 MG/ML IJ SOLN
INTRAMUSCULAR | Status: DC | PRN
  Administered 2015-07-24: 20 mg via INTRAVENOUS

## 2015-07-24 MED ORDER — STERILE WATER FOR INJECTION IJ SOLN
INTRAMUSCULAR | Status: AC
  Filled 2015-07-24: qty 10

## 2015-07-24 MED ORDER — MIDAZOLAM HCL 2 MG/2ML IJ SOLN
INTRAMUSCULAR | Status: AC
  Filled 2015-07-24: qty 2

## 2015-07-24 MED ORDER — DIPHENHYDRAMINE HCL 50 MG/ML IJ SOLN
INTRAMUSCULAR | Status: DC | PRN
  Administered 2015-07-24: 50 mg via INTRAVENOUS

## 2015-07-24 MED ORDER — MIDAZOLAM HCL 2 MG/2ML IJ SOLN
INTRAMUSCULAR | Status: DC | PRN
  Administered 2015-07-24 (×6): 1 mg via INTRAVENOUS

## 2015-07-24 MED ORDER — FENTANYL CITRATE (PF) 100 MCG/2ML IJ SOLN
INTRAMUSCULAR | Status: AC
  Filled 2015-07-24: qty 2

## 2015-07-24 SURGICAL SUPPLY — 29 items
BALLN DORADO 5X200X135 (BALLOONS) ×4
BALLN DORADO 6X40X80 (BALLOONS) ×4
BALLN LUTONIX 4X150X130 (BALLOONS) ×4
BALLN LUTONIX 5X150X130 (BALLOONS) ×4
BALLN LUTONIX DCB 7X60X130 (BALLOONS) ×4
BALLN ULTRVRSE 2.5X300X150 (BALLOONS) ×4
BALLN ULTRVRSE 3X300X130 (BALLOONS) ×4
BALLOON DORADO 5X200X135 (BALLOONS) ×2 IMPLANT
BALLOON DORADO 6X40X80 (BALLOONS) ×2 IMPLANT
BALLOON LUTONIX 4X150X130 (BALLOONS) ×2 IMPLANT
BALLOON LUTONIX 5X150X130 (BALLOONS) ×2 IMPLANT
BALLOON LUTONIX DCB 7X60X130 (BALLOONS) ×2 IMPLANT
BALLOON ULTRVRSE 2.5X300X150 (BALLOONS) ×2 IMPLANT
BALLOON ULTRVRSE 3X300X130 (BALLOONS) ×2 IMPLANT
CATH KMP 5.0X100 (CATHETERS) ×4 IMPLANT
CATH RIM 65CM (CATHETERS) ×4 IMPLANT
DEVICE PRESTO INFLATION (MISCELLANEOUS) ×4 IMPLANT
DEVICE SOLENT OMNI 120CM (CATHETERS) ×4 IMPLANT
DEVICE STARCLOSE SE CLOSURE (Vascular Products) ×4 IMPLANT
GLIDEWIRE ADV .035X260CM (WIRE) ×4 IMPLANT
GUIDEWIRE GOLD .018X300 (WIRE) ×4 IMPLANT
GUIDEWIRE PFTE-COATED .018X300 (WIRE) ×4 IMPLANT
PACK ANGIOGRAPHY (CUSTOM PROCEDURE TRAY) ×4 IMPLANT
SHEATH ANL2 6FRX45 HC (SHEATH) ×4 IMPLANT
SHEATH AVANTI 4FRX11 (SHEATH) ×4 IMPLANT
SHEATH BRITE TIP 5FRX11 (SHEATH) ×4 IMPLANT
STENT VIABAHN 5X250X120 (Permanent Stent) ×8 IMPLANT
WIRE G V18X300CM (WIRE) ×4 IMPLANT
WIRE J 3MM .035X145CM (WIRE) ×4 IMPLANT

## 2015-07-24 NOTE — CV Procedure (Signed)
[facility] Percutaneous Study/Intervention Procedural Note   Date of Surgery: 07/24/2015  Surgeon(s):Brighten Orndoff   Assistants:none  Pre-operative Diagnosis: PAD with ulceration of the left lower extremity and early reocclusion of previous intervention  Post-operative diagnosis: Same  Procedure(s) Performed: 1. Ultrasound guidance for vascular access right femoral artery 2. Catheter placement into left anterior tibial artery from right femoral approach 3. Selective left lower extremity angiogram 4. Percutaneous transluminal angioplasty of distal external iliac artery and common femoral artery with 7 mm diameter Lutonix drug-coated angioplasty balloon 5. Percutaneous transluminal angioplasty of left anterior tibial artery with 2.5 mm diameter distal and 3 mm proximal angioplasty balloons  6.  Percutaneous transluminal angioplasty of left popliteal artery with 4 mm diameter Lutonix drug-coated angioplasty balloon  7.  Percutaneous transluminal angioplasty of left superficial femoral artery with 5 mm diameter Lutonix drug-coated angioplasty balloon proximally and 5 mm balloon for mid and distal segments  8.  Viabahn covered stent placement 5 mm diameter by 25 cm length to the left superficial femoral artery for greater than 50% residual stenosis after angioplasty  9.  Viabahn covered stent placement 5 mm diameter by 25 cm length to the left popliteal artery for greater than 50% residual stenosis and dissection after angioplasty  10. Catheter directed thrombolysis with 4 mg of TPA delivered with the AngioJet Omni catheter to the left superficial femoral artery, popliteal artery, and proximal anterior tibial artery  11. Mechanical rheolytic thrombectomy with the AngioJet Omni catheter to the left superficial femoral artery, popliteal artery, and proximal anterior tibial artery 12. StarClose  closure device right femoral artery  EBL: 25 cc  Indications: Patient is a 81 year old female with extensive peripheral vascular disease and ulceration of the left lower extremity with early reocclusion of a previous intervention. The patient has noninvasive study showing flat waveforms distally consistent with marked arterial insufficiency. The patient is brought in for angiography for further evaluation and potential treatment. Risks and benefits are discussed and informed consent is obtained  Procedure: The patient was identified and appropriate procedural time out was performed. The patient was then placed supine on the table and prepped and draped in the usual sterile fashion. Ultrasound was used to evaluate the right common femoral artery. It was patent . A digital ultrasound image was acquired. A Seldinger needle was used to access the right common femoral artery under direct ultrasound guidance and a permanent image was performed. A 0.035 J wire was advanced without resistance and a 5Fr sheath was placed. As we had previously seen no significant aortoiliac stenosis, I then crossed the aortic bifurcation and advanced to the left femoral head. Selective left lower extremity angiogram was then performed. This demonstrated 50-60% stenosis in the distal left external iliac artery and proximal common femoral artery and thrombosis of the previous interventions in the left SFA and popliteal artery as well as the proximal anterior tibial artery. The anterior tibial artery did reconstitute distally and was the best runoff to the foot. The patient was systemically heparinized and a 6 [place] Ansell sheath was then placed over the [employer] wire. I then used a Kumpe catheter and the advantage wire to navigate through the occlusion and eventually gain access into the anterior tibial artery. This was a tedious and difficult occlusion to cross and ultimately I used a CXI catheter and a 0.018  wire into the anterior tibial artery. I started by placing 4 mg of TPA and a power pulse spray fashion with the AngioJet Omni catheter  in the left superficial femoral artery, popliteal artery, and proximal anterior tibial artery. This was allowed to dwell for 10-15 minutes. I then performed mechanical rheolytic thrombectomy with the AngioJet Omni catheter in the left superficial femoral artery, popliteal artery, and proximal anterior tibial artery. Approximately 85 cc of effluent was returned. There was still continued occlusion. I then began treating the extensive disease with angioplasty. The distal external iliac artery and proximal common femoral artery was treated with a 7 mm diameter Lutonix drug-coated angioplasty balloon. There was still about a 30-40% residual stenosis after angioplasty, but this was in a location where stent could not be placed. I used a 3 mm diameter angioplasty balloon in the proximal anterior tibial artery and a 2.5 mm diameter angioplasty balloon in the distal anterior tibial artery for native disease just above the ankle that may have been a combination of thrombus and stenosis. The popliteal artery was treated with a 4 mm diameter Lutonix drug-coated angioplasty balloon inflated to 12 atm. I used a 5 mm diameter Lutonix drug-coated angioplasty balloon to treat the proximal SFA and inflated this to 10 atm. The 5 mm balloon was used to treat the mid and distal SFA as well even though it would not be a drug-coated inflation after the first use. Following this, there was still minimal flow with a spiral dissection in the popliteal artery and near occlusive stenosis. There was also residual stenosis in the above-knee popliteal artery as well as at the SFA at its origin. I then used 2 long 5 mm diameter Viabahn stents with the distal stent placed to the distal popliteal artery just above the anterior tibial artery in the proximal stent placed just into the SFA about 1 cm after it's origin  taking care not to injure the profunda femoris artery. The stents overlap slightly in the distal SFA. These were postdilated with 5 mm diameter high pressure balloon and a 6 mm diameter high pressure balloon was used at the origin of the SFA and at the area of overlap in the distal SFA/above-knee popliteal artery. Following these extensive interventions, there was now in-line flow to the foot through the anterior tibial artery with the SFA and popliteal arteries now being patent with no areas of greater than 30% residual stenosis. The SFA origin had the worst residual stenosis of about 30%, but this did not appear flow limiting and was in a location where stent could not be placed. At this point, I felt we had improved her flow as much as possible. I elected to terminate the procedure. The sheath was removed and StarClose closure device was deployed in the left femoral artery with excellent hemostatic result. The patient was taken to the recovery room in stable condition having tolerated the procedure well.  Findings:   LEFT Lower Extremity: reocclusion of previous intervention with diffuse disease   Disposition: Patient was taken to the recovery room in stable condition having tolerated the procedure well.  Complications: None  Devany Aja 07/24/2015 6:24 PM

## 2015-07-24 NOTE — H&P (Signed)
[facility] History & Physical Update  The patient was interviewed and re-examined.  The patient's previous History and Physical has been reviewed and is unchanged.  There is no change in the plan of care. We plan to proceed with the scheduled procedure.  Emera Bussie, MD  07/24/2015, 3:10 PM

## 2015-07-27 ENCOUNTER — Encounter: Payer: Self-pay | Admitting: Vascular Surgery

## 2015-09-01 ENCOUNTER — Emergency Department: Payer: Medicare (Managed Care)

## 2015-09-01 ENCOUNTER — Encounter: Payer: Self-pay | Admitting: Emergency Medicine

## 2015-09-01 ENCOUNTER — Inpatient Hospital Stay
Admission: EM | Admit: 2015-09-01 | Discharge: 2015-09-09 | DRG: 871 | Disposition: A | Discharge status: Skilled Nursing Facility | Payer: Medicare (Managed Care) | Attending: Internal Medicine | Admitting: Internal Medicine

## 2015-09-01 DIAGNOSIS — I10 Essential (primary) hypertension: Secondary | ICD-10-CM

## 2015-09-01 DIAGNOSIS — N179 Acute kidney failure, unspecified: Secondary | ICD-10-CM | POA: Diagnosis present

## 2015-09-01 DIAGNOSIS — R6521 Severe sepsis with septic shock: Secondary | ICD-10-CM | POA: Diagnosis present

## 2015-09-01 DIAGNOSIS — A419 Sepsis, unspecified organism: Secondary | ICD-10-CM | POA: Diagnosis not present

## 2015-09-01 DIAGNOSIS — R7989 Other specified abnormal findings of blood chemistry: Secondary | ICD-10-CM

## 2015-09-01 DIAGNOSIS — D72829 Elevated white blood cell count, unspecified: Secondary | ICD-10-CM

## 2015-09-01 DIAGNOSIS — E860 Dehydration: Secondary | ICD-10-CM | POA: Diagnosis present

## 2015-09-01 DIAGNOSIS — R630 Anorexia: Secondary | ICD-10-CM | POA: Diagnosis present

## 2015-09-01 DIAGNOSIS — Z7902 Long term (current) use of antithrombotics/antiplatelets: Secondary | ICD-10-CM

## 2015-09-01 DIAGNOSIS — E11621 Type 2 diabetes mellitus with foot ulcer: Secondary | ICD-10-CM | POA: Diagnosis present

## 2015-09-01 DIAGNOSIS — G9341 Metabolic encephalopathy: Secondary | ICD-10-CM | POA: Diagnosis present

## 2015-09-01 DIAGNOSIS — M1990 Unspecified osteoarthritis, unspecified site: Secondary | ICD-10-CM | POA: Diagnosis present

## 2015-09-01 DIAGNOSIS — E872 Acidosis, unspecified: Secondary | ICD-10-CM

## 2015-09-01 DIAGNOSIS — G9340 Encephalopathy, unspecified: Secondary | ICD-10-CM | POA: Diagnosis present

## 2015-09-01 DIAGNOSIS — I700 Atherosclerosis of aorta: Secondary | ICD-10-CM | POA: Diagnosis present

## 2015-09-01 DIAGNOSIS — E1152 Type 2 diabetes mellitus with diabetic peripheral angiopathy with gangrene: Secondary | ICD-10-CM | POA: Diagnosis present

## 2015-09-01 DIAGNOSIS — E785 Hyperlipidemia, unspecified: Secondary | ICD-10-CM | POA: Diagnosis present

## 2015-09-01 DIAGNOSIS — E871 Hypo-osmolality and hyponatremia: Secondary | ICD-10-CM | POA: Diagnosis present

## 2015-09-01 DIAGNOSIS — J449 Chronic obstructive pulmonary disease, unspecified: Secondary | ICD-10-CM | POA: Diagnosis present

## 2015-09-01 DIAGNOSIS — J189 Pneumonia, unspecified organism: Secondary | ICD-10-CM | POA: Diagnosis present

## 2015-09-01 DIAGNOSIS — F0390 Unspecified dementia without behavioral disturbance: Secondary | ICD-10-CM | POA: Diagnosis present

## 2015-09-01 DIAGNOSIS — I739 Peripheral vascular disease, unspecified: Secondary | ICD-10-CM | POA: Diagnosis present

## 2015-09-01 DIAGNOSIS — N183 Chronic kidney disease, stage 3 (moderate): Secondary | ICD-10-CM | POA: Diagnosis present

## 2015-09-01 DIAGNOSIS — Z66 Do not resuscitate: Secondary | ICD-10-CM | POA: Diagnosis present

## 2015-09-01 DIAGNOSIS — I96 Gangrene, not elsewhere classified: Secondary | ICD-10-CM

## 2015-09-01 DIAGNOSIS — I499 Cardiac arrhythmia, unspecified: Secondary | ICD-10-CM

## 2015-09-01 DIAGNOSIS — K529 Noninfective gastroenteritis and colitis, unspecified: Secondary | ICD-10-CM | POA: Diagnosis present

## 2015-09-01 DIAGNOSIS — L97529 Non-pressure chronic ulcer of other part of left foot with unspecified severity: Secondary | ICD-10-CM | POA: Diagnosis present

## 2015-09-01 DIAGNOSIS — I248 Other forms of acute ischemic heart disease: Secondary | ICD-10-CM | POA: Diagnosis present

## 2015-09-01 DIAGNOSIS — J181 Lobar pneumonia, unspecified organism: Secondary | ICD-10-CM

## 2015-09-01 DIAGNOSIS — R109 Unspecified abdominal pain: Secondary | ICD-10-CM

## 2015-09-01 DIAGNOSIS — E1122 Type 2 diabetes mellitus with diabetic chronic kidney disease: Secondary | ICD-10-CM | POA: Diagnosis present

## 2015-09-01 DIAGNOSIS — Z823 Family history of stroke: Secondary | ICD-10-CM

## 2015-09-01 DIAGNOSIS — I2510 Atherosclerotic heart disease of native coronary artery without angina pectoris: Secondary | ICD-10-CM | POA: Diagnosis present

## 2015-09-01 DIAGNOSIS — E119 Type 2 diabetes mellitus without complications: Secondary | ICD-10-CM

## 2015-09-01 DIAGNOSIS — R8281 Pyuria: Secondary | ICD-10-CM

## 2015-09-01 DIAGNOSIS — I959 Hypotension, unspecified: Secondary | ICD-10-CM | POA: Diagnosis present

## 2015-09-01 DIAGNOSIS — N189 Chronic kidney disease, unspecified: Secondary | ICD-10-CM

## 2015-09-01 DIAGNOSIS — I129 Hypertensive chronic kidney disease with stage 1 through stage 4 chronic kidney disease, or unspecified chronic kidney disease: Secondary | ICD-10-CM | POA: Diagnosis present

## 2015-09-01 DIAGNOSIS — D649 Anemia, unspecified: Secondary | ICD-10-CM

## 2015-09-01 DIAGNOSIS — R778 Other specified abnormalities of plasma proteins: Secondary | ICD-10-CM | POA: Diagnosis present

## 2015-09-01 DIAGNOSIS — Z452 Encounter for adjustment and management of vascular access device: Secondary | ICD-10-CM

## 2015-09-01 HISTORY — DX: Anemia, unspecified: D64.9

## 2015-09-01 HISTORY — DX: Peripheral vascular disease, unspecified: I73.9

## 2015-09-01 HISTORY — DX: Unspecified osteoarthritis, unspecified site: M19.90

## 2015-09-01 LAB — URINALYSIS COMPLETE WITH MICROSCOPIC (ARMC ONLY)
GLUCOSE, UA: NEGATIVE mg/dL
HGB URINE DIPSTICK: NEGATIVE
Leukocytes, UA: NEGATIVE
NITRITE: NEGATIVE
Protein, ur: 100 mg/dL — AB
SPECIFIC GRAVITY, URINE: 1.024 (ref 1.005–1.030)
Trans Epithel, UA: 1
pH: 5 (ref 5.0–8.0)

## 2015-09-01 MED ORDER — IOHEXOL 240 MG/ML SOLN
25.0000 mL | Freq: Once | INTRAMUSCULAR | Status: AC | PRN
  Administered 2015-09-01: 25 mL via ORAL

## 2015-09-01 MED ORDER — ASPIRIN EC 325 MG PO TBEC
DELAYED_RELEASE_TABLET | ORAL | Status: AC
  Filled 2015-09-01: qty 1

## 2015-09-01 NOTE — ED Notes (Signed)
Pt arrives via EMS from home.  Pt with decreased Mental Status beginning yesterday afternoon.  Pt usually active and utilizes walker.  Pt not very responsive to care givers and not ambulating, just in bed.  Pt takes oxycodone for pain.  EMS gave  Narcan @ 2238 and  Zofran @ 2245 en-route.  Pt following commands upon arrival to ED.  Pt with large loose stool en-route.  Pt cleaned and placed in pt gown.

## 2015-09-01 NOTE — ED Provider Notes (Signed)
[facility] Emergency Department [name] Note  ____________________________________________  Time seen: Approximately 11:37 PM  I have reviewed the triage vital signs and the nursing notes.   HISTORY  Chief Complaint Altered Mental Status  history from EMS patient's family is on the way. History the patient is limited by her altered mental status  HPI [name] is a 81 y.o. female who usually walks around home with a walker and is awake and alert. Patient has become less alert the last 2 days. Patient is not walking as much. She had a large diarrheal stool and another one then in the ER. Patient denies any medical problems and says nothing is bothering her.   Past Medical History  Diagnosis Date  . Abnormal heart rhythms   . Diabetes mellitus without complication   . Coronary artery disease   . Hyperlipidemia   . Hypertension   . Chronic kidney disease   . Varicose veins   . Peripheral vascular disease   . Anemia   . Arthritis     Patient Active Problem List   Diagnosis Date Noted  . Acute encephalopathy 09/02/2015  . Sepsis 09/02/2015  . Acute-on-chronic kidney injury 09/02/2015  . Enteritis 09/02/2015  . Hyponatremia 09/02/2015  . Elevated troponin 09/02/2015  . DM (diabetes mellitus) 09/02/2015  . HTN (hypertension) 09/02/2015  . Dementia 09/02/2015  . PAD (peripheral artery disease) 09/02/2015  . Anemia 09/02/2015    Past Surgical History  Procedure Laterality Date  . Appendectomy    . Abdominal hysterectomy    . Cataract surgery    . Peripheral vascular catheterization Left 07/13/2015    Procedure: Lower Extremity Angiography;  Surgeon: Annice Needy, MD;  Location: [facility] INVASIVE CV LAB;  Service: Cardiovascular;  Laterality: Left;  . Peripheral vascular catheterization  07/13/2015    Procedure: Lower Extremity Intervention;  Surgeon: Annice Needy, MD;  Location: [facility] INVASIVE CV LAB;  Service: Cardiovascular;;  . Peripheral  vascular catheterization Left 07/24/2015    Procedure: Lower Extremity Angiography;  Surgeon: Annice Needy, MD;  Location: [facility] INVASIVE CV LAB;  Service: Cardiovascular;  Laterality: Left;  . Peripheral vascular catheterization  07/24/2015    Procedure: Lower Extremity Intervention;  Surgeon: Annice Needy, MD;  Location: [facility] INVASIVE CV LAB;  Service: Cardiovascular;;    Current Outpatient Rx  Name  Route  Sig  Dispense  Refill  . albuterol (PROVENTIL HFA;VENTOLIN HFA) 108 (90 BASE) MCG/ACT inhaler   Inhalation   Inhale into the lungs every 6 (six) hours as needed for wheezing or shortness of breath.         Marland Kitchen amLODipine (NORVASC) 5 MG tablet   Oral   Take 5 mg by mouth daily.         Marland Kitchen atenolol (TENORMIN) 50 MG tablet   Oral   Take 50 mg by mouth daily.         . clopidogrel (PLAVIX) 75 MG tablet   Oral   Take 75 mg by mouth daily.         . ergocalciferol (VITAMIN D2) 50000 UNITS capsule   Oral   Take 50,000 Units by mouth once a week.         . Fluticasone-Salmeterol (ADVAIR) 500-50 MCG/DOSE AEPB   Inhalation   Inhale 1 puff into the lungs 2 (two) times daily.         Marland Kitchen glipiZIDE (GLUCOTROL XL) 10 MG 24 hr tablet   Oral   Take 10 mg  by mouth daily with breakfast.         . omeprazole (PRILOSEC) 20 MG capsule   Oral   Take 20 mg by mouth daily.         Marland Kitchen oxyCODONE-acetaminophen (PERCOCET/ROXICET) 5-325 MG per tablet   Oral   Take 1 tablet by mouth every 4 (four) hours as needed for severe pain (every 4-6hours for pain).         . quinapril (ACCUPRIL) 40 MG tablet   Oral   Take 40 mg by mouth at bedtime.           Allergies Review of patient's allergies indicates no known allergies.  Family History  Problem Relation Age of Onset  . Kidney disease Mother   . Cancer Sister   . Cancer Brother   . CVA Brother     Social History Social History  Substance Use Topics  . Smoking status: Former Smoker -- 1.00 packs/day for 42 years    Types:  Cigarettes  . Smokeless tobacco: Current User    Types: Snuff  . Alcohol Use: 1.2 oz/week    2 Shots of liquor per week    Review of Systems Limited by patient's parent confusion  Constitutional: No fever/chills Eyes: No visual changes. ENT: No sore throat. Cardiovascular: Denies chest pain. Respiratory: Denies shortness of breath. Gastrointestinal: No abdominal pain.  No nausea, no vomiting.  No diarrhea.  No constipation. Genitourinary: Negative for dysuria. Musculoskeletal: Negative for back pain. Skin: Negative for rash. Neurological: Negative for headaches, focal weakness or numbness.  10-point ROS otherwise negative.  ____________________________________________   PHYSICAL EXAM:  VITAL SIGNS: ED Triage Vitals  Enc Vitals Group     BP 09/01/15 2330 139/115 mmHg     Pulse Rate 09/01/15 2330 76     Resp 09/01/15 2330 20     Temp 09/01/15 2330 98 F (36.7 C)     Temp Source 09/01/15 2330 Oral     SpO2 09/01/15 2330 88 %     Weight --      Height --      Head Cir --      Peak Flow --      Pain Score --      Pain Loc --      Pain Edu? --      Excl. in GC? --     Constitutional: Alert and oriented to hospital and person. Well appearing and in no acute distress. Eyes: Conjunctivae are normal. PERRL. EOMI. Head: Atraumatic. Nose: No congestion/rhinnorhea. Mouth/Throat: Mucous membranes are moist.  Oropharynx non-erythematous. Neck: No stridor.  Cardiovascular: Normal rate, regular rhythm. Grossly normal heart sounds.  Good peripheral circulation. Respiratory: Normal respiratory effort.  No retractions. Lungs scattered wheezes crackles rales and other noises Gastrointestinal: Soft and nontender. No distention. No abdominal bruits. No CVA tenderness. Musculoskeletal: No lower extremity tenderness nor edema.  No joint effusions. Patient has an necrotic left little toe and a necrotic ulcer over her left Achilles tendon Neurologic:  Normal speech and language. No  gross focal neurologic deficits are appreciated. No gait instability. Skin:  Skin is warm, dry and intact. No rash noted except for necrotic areas as noted   ____________________________________________   LABS (all labs ordered are listed, but only abnormal results are displayed)  Labs Reviewed  COMPREHENSIVE METABOLIC PANEL - Abnormal; Notable for the following:    Sodium 129 (*)    Chloride 91 (*)    Glucose, Bld 260 (*)    BUN  48 (*)    Creatinine, Ser 3.16 (*)    Calcium 8.3 (*)    Total Protein 6.4 (*)    GFR calc non Af Amer 12 (*)    GFR calc Af Amer 14 (*)    All other components within normal limits  TROPONIN I - Abnormal; Notable for the following:    Troponin I 0.05 (*)    All other components within normal limits  LACTIC ACID, PLASMA - Abnormal; Notable for the following:    Lactic Acid, Venous 4.1 (*)    All other components within normal limits  CBC WITH DIFFERENTIAL/PLATELET - Abnormal; Notable for the following:    WBC 29.0 (*)    RBC 3.20 (*)    Hemoglobin 9.7 (*)    HCT 30.0 (*)    nRBC 2 (*)    Neutro Abs 22.0 (*)    Monocytes Absolute 3.5 (*)    All other components within normal limits  URINALYSIS COMPLETEWITH MICROSCOPIC ([facility] ONLY) - Abnormal; Notable for the following:    Color, Urine AMBER (*)    APPearance CLOUDY (*)    Bilirubin Urine 1+ (*)    Ketones, ur TRACE (*)    Protein, ur 100 (*)    Bacteria, UA RARE (*)    Squamous Epithelial / LPF 0-5 (*)    All other components within normal limits  URINE CULTURE  CULTURE, BLOOD (ROUTINE X 2)  CULTURE, BLOOD (ROUTINE X 2)  LIPASE, BLOOD  LACTIC ACID, PLASMA  PATHOLOGIST SMEAR REVIEW   ____________________________________________  EKG  EKG is not currently available ____________________________________________  RADIOLOGY  CT the head read by radiologist as no acute disease chest x-ray shows possible infiltrate a reviewed this film myself as well. CT the abdomen shows a lot of liquid  stool and a stable cyst in the left pelvis. ____________________________________________   PROCEDURES   ____________________________________________   INITIAL IMPRESSION / ASSESSMENT AND PLAN / ED COURSE  Pertinent labs & imaging results that were available during my care of the patient were reviewed by me and considered in my medical decision making (see chart for details). Patient's blood pressure dropped second IV is started more fluids given patient's blood pressure goes back up again. Discussed with the family patient's prognosis which at this point is not good and the possible etiologies of her sepsis including pneumonia the diarrhea possibility of C. difficile affect the Achilles ulcer of the toe do not look infected at this point. Stress with hospitalist twice. Critical care time 30 minutes ______________________________________   FINAL CLINICAL IMPRESSION(S) / ED DIAGNOSES  Final diagnoses:  Sepsis, due to unspecified organism      Arnaldo Natal, MD 09/02/15 [phone]

## 2015-09-02 ENCOUNTER — Encounter: Payer: Self-pay | Admitting: Internal Medicine

## 2015-09-02 ENCOUNTER — Emergency Department: Payer: Medicare (Managed Care)

## 2015-09-02 DIAGNOSIS — E860 Dehydration: Secondary | ICD-10-CM | POA: Diagnosis present

## 2015-09-02 DIAGNOSIS — K529 Noninfective gastroenteritis and colitis, unspecified: Secondary | ICD-10-CM | POA: Diagnosis present

## 2015-09-02 DIAGNOSIS — N179 Acute kidney failure, unspecified: Secondary | ICD-10-CM | POA: Diagnosis present

## 2015-09-02 DIAGNOSIS — J449 Chronic obstructive pulmonary disease, unspecified: Secondary | ICD-10-CM | POA: Diagnosis present

## 2015-09-02 DIAGNOSIS — I739 Peripheral vascular disease, unspecified: Secondary | ICD-10-CM | POA: Diagnosis present

## 2015-09-02 DIAGNOSIS — L97529 Non-pressure chronic ulcer of other part of left foot with unspecified severity: Secondary | ICD-10-CM | POA: Diagnosis present

## 2015-09-02 DIAGNOSIS — I10 Essential (primary) hypertension: Secondary | ICD-10-CM

## 2015-09-02 DIAGNOSIS — R630 Anorexia: Secondary | ICD-10-CM | POA: Diagnosis present

## 2015-09-02 DIAGNOSIS — R778 Other specified abnormalities of plasma proteins: Secondary | ICD-10-CM | POA: Diagnosis present

## 2015-09-02 DIAGNOSIS — I129 Hypertensive chronic kidney disease with stage 1 through stage 4 chronic kidney disease, or unspecified chronic kidney disease: Secondary | ICD-10-CM | POA: Diagnosis present

## 2015-09-02 DIAGNOSIS — E871 Hypo-osmolality and hyponatremia: Secondary | ICD-10-CM | POA: Diagnosis present

## 2015-09-02 DIAGNOSIS — E872 Acidosis: Secondary | ICD-10-CM | POA: Diagnosis present

## 2015-09-02 DIAGNOSIS — Z7902 Long term (current) use of antithrombotics/antiplatelets: Secondary | ICD-10-CM | POA: Diagnosis not present

## 2015-09-02 DIAGNOSIS — G9341 Metabolic encephalopathy: Secondary | ICD-10-CM | POA: Diagnosis present

## 2015-09-02 DIAGNOSIS — D649 Anemia, unspecified: Secondary | ICD-10-CM | POA: Diagnosis present

## 2015-09-02 DIAGNOSIS — M1990 Unspecified osteoarthritis, unspecified site: Secondary | ICD-10-CM | POA: Diagnosis present

## 2015-09-02 DIAGNOSIS — A419 Sepsis, unspecified organism: Secondary | ICD-10-CM | POA: Diagnosis present

## 2015-09-02 DIAGNOSIS — E1152 Type 2 diabetes mellitus with diabetic peripheral angiopathy with gangrene: Secondary | ICD-10-CM | POA: Diagnosis present

## 2015-09-02 DIAGNOSIS — I700 Atherosclerosis of aorta: Secondary | ICD-10-CM | POA: Diagnosis present

## 2015-09-02 DIAGNOSIS — G9340 Encephalopathy, unspecified: Secondary | ICD-10-CM | POA: Diagnosis present

## 2015-09-02 DIAGNOSIS — Z66 Do not resuscitate: Secondary | ICD-10-CM | POA: Diagnosis present

## 2015-09-02 DIAGNOSIS — F0390 Unspecified dementia without behavioral disturbance: Secondary | ICD-10-CM

## 2015-09-02 DIAGNOSIS — R7989 Other specified abnormal findings of blood chemistry: Secondary | ICD-10-CM | POA: Diagnosis present

## 2015-09-02 DIAGNOSIS — I2510 Atherosclerotic heart disease of native coronary artery without angina pectoris: Secondary | ICD-10-CM | POA: Diagnosis present

## 2015-09-02 DIAGNOSIS — I499 Cardiac arrhythmia, unspecified: Secondary | ICD-10-CM | POA: Diagnosis present

## 2015-09-02 DIAGNOSIS — I959 Hypotension, unspecified: Secondary | ICD-10-CM | POA: Diagnosis present

## 2015-09-02 DIAGNOSIS — E11621 Type 2 diabetes mellitus with foot ulcer: Secondary | ICD-10-CM | POA: Diagnosis present

## 2015-09-02 DIAGNOSIS — Z823 Family history of stroke: Secondary | ICD-10-CM | POA: Diagnosis not present

## 2015-09-02 DIAGNOSIS — E785 Hyperlipidemia, unspecified: Secondary | ICD-10-CM | POA: Diagnosis present

## 2015-09-02 DIAGNOSIS — R6521 Severe sepsis with septic shock: Secondary | ICD-10-CM | POA: Diagnosis present

## 2015-09-02 DIAGNOSIS — E119 Type 2 diabetes mellitus without complications: Secondary | ICD-10-CM

## 2015-09-02 DIAGNOSIS — I248 Other forms of acute ischemic heart disease: Secondary | ICD-10-CM | POA: Diagnosis present

## 2015-09-02 DIAGNOSIS — N189 Chronic kidney disease, unspecified: Secondary | ICD-10-CM

## 2015-09-02 DIAGNOSIS — J189 Pneumonia, unspecified organism: Secondary | ICD-10-CM | POA: Diagnosis present

## 2015-09-02 DIAGNOSIS — E1122 Type 2 diabetes mellitus with diabetic chronic kidney disease: Secondary | ICD-10-CM | POA: Diagnosis present

## 2015-09-02 DIAGNOSIS — N183 Chronic kidney disease, stage 3 (moderate): Secondary | ICD-10-CM | POA: Diagnosis present

## 2015-09-02 LAB — COMPREHENSIVE METABOLIC PANEL
ALBUMIN: 3.6 g/dL (ref 3.5–5.0)
ALT: 16 U/L (ref 14–54)
AST: 28 U/L (ref 15–41)
Alkaline Phosphatase: 78 U/L (ref 38–126)
Anion gap: 15 (ref 5–15)
BUN: 48 mg/dL — AB (ref 6–20)
CHLORIDE: 91 mmol/L — AB (ref 101–111)
CO2: 23 mmol/L (ref 22–32)
Calcium: 8.3 mg/dL — ABNORMAL LOW (ref 8.9–10.3)
Creatinine, Ser: 3.16 mg/dL — ABNORMAL HIGH (ref 0.44–1.00)
GFR calc Af Amer: 14 mL/min — ABNORMAL LOW (ref >60)
GFR, EST NON AFRICAN AMERICAN: 12 mL/min — AB (ref >60)
Glucose, Bld: 260 mg/dL — ABNORMAL HIGH (ref 65–99)
POTASSIUM: 4.8 mmol/L (ref 3.5–5.1)
Sodium: 129 mmol/L — ABNORMAL LOW (ref 135–145)
Total Bilirubin: 0.8 mg/dL (ref 0.3–1.2)
Total Protein: 6.4 g/dL — ABNORMAL LOW (ref 6.5–8.1)

## 2015-09-02 LAB — CBC WITH DIFFERENTIAL/PLATELET
BASOS ABS: 0 10*3/uL (ref 0–0.1)
BLASTS: 0 %
Band Neutrophils: 7 %
Basophils Relative: 0 %
EOS PCT: 1 %
Eosinophils Absolute: 0.3 10*3/uL (ref 0–0.7)
HEMATOCRIT: 30 % — AB (ref 35.0–47.0)
HEMOGLOBIN: 9.7 g/dL — AB (ref 12.0–16.0)
LYMPHS ABS: 3.2 10*3/uL (ref 1.0–3.6)
Lymphocytes Relative: 11 %
MCH: 30.2 pg (ref 26.0–34.0)
MCHC: 32.2 g/dL (ref 32.0–36.0)
MCV: 93.5 fL (ref 80.0–100.0)
METAMYELOCYTES PCT: 7 %
MONOS PCT: 12 %
MYELOCYTES: 5 %
Monocytes Absolute: 3.5 10*3/uL — ABNORMAL HIGH (ref 0.2–0.9)
NEUTROS ABS: 22 10*3/uL — AB (ref 1.4–6.5)
NEUTROS PCT: 57 %
NRBC: 2 /100{WBCs} — AB
Other: 0 %
Platelets: 312 10*3/uL (ref 150–440)
Promyelocytes Absolute: 0 %
RBC: 3.2 MIL/uL — AB (ref 3.80–5.20)
RDW: 13.3 % (ref 11.5–14.5)
WBC: 29 10*3/uL — AB (ref 3.6–11.0)

## 2015-09-02 LAB — BASIC METABOLIC PANEL
ANION GAP: 11 (ref 5–15)
BUN: 47 mg/dL — ABNORMAL HIGH (ref 6–20)
CHLORIDE: 97 mmol/L — AB (ref 101–111)
CO2: 21 mmol/L — AB (ref 22–32)
Calcium: 7.7 mg/dL — ABNORMAL LOW (ref 8.9–10.3)
Creatinine, Ser: 2.93 mg/dL — ABNORMAL HIGH (ref 0.44–1.00)
GFR calc non Af Amer: 13 mL/min — ABNORMAL LOW (ref >60)
GFR, EST AFRICAN AMERICAN: 15 mL/min — AB (ref >60)
Glucose, Bld: 208 mg/dL — ABNORMAL HIGH (ref 65–99)
POTASSIUM: 5.1 mmol/L (ref 3.5–5.1)
SODIUM: 129 mmol/L — AB (ref 135–145)

## 2015-09-02 LAB — LIPASE, BLOOD: LIPASE: 41 U/L (ref 22–51)

## 2015-09-02 LAB — CBC
HEMATOCRIT: 27.7 % — AB (ref 35.0–47.0)
HEMOGLOBIN: 9 g/dL — AB (ref 12.0–16.0)
MCH: 30.2 pg (ref 26.0–34.0)
MCHC: 32.5 g/dL (ref 32.0–36.0)
MCV: 92.9 fL (ref 80.0–100.0)
Platelets: 286 10*3/uL (ref 150–440)
RBC: 2.98 MIL/uL — ABNORMAL LOW (ref 3.80–5.20)
RDW: 12.9 % (ref 11.5–14.5)
WBC: 28.7 10*3/uL — ABNORMAL HIGH (ref 3.6–11.0)

## 2015-09-02 LAB — MRSA PCR SCREENING: MRSA BY PCR: NEGATIVE

## 2015-09-02 LAB — GLUCOSE, CAPILLARY
GLUCOSE-CAPILLARY: 270 mg/dL — AB (ref 65–99)
GLUCOSE-CAPILLARY: 266 mg/dL — AB (ref 65–99)
Glucose-Capillary: 194 mg/dL — ABNORMAL HIGH (ref 65–99)
Glucose-Capillary: 136 mg/dL — ABNORMAL HIGH (ref 65–99)

## 2015-09-02 LAB — C DIFFICILE QUICK SCREEN W PCR REFLEX
C DIFFICILE (CDIFF) TOXIN: NEGATIVE
C DIFFICLE (CDIFF) ANTIGEN: NEGATIVE
C Diff interpretation: NEGATIVE

## 2015-09-02 LAB — TROPONIN I
Troponin I: 0.05 ng/mL — ABNORMAL HIGH (ref <0.031)
Troponin I: 0.05 ng/mL — ABNORMAL HIGH (ref <0.031)
Troponin I: 0.06 ng/mL — ABNORMAL HIGH (ref <0.031)
Troponin I: 0.06 ng/mL — ABNORMAL HIGH (ref <0.031)

## 2015-09-02 LAB — LACTIC ACID, PLASMA
LACTIC ACID, VENOUS: 4.1 mmol/L — AB (ref 0.5–2.0)
Lactic Acid, Venous: 2.6 mmol/L (ref 0.5–2.0)

## 2015-09-02 LAB — BLOOD GAS, VENOUS
Acid-base deficit: 7.5 mmol/L — ABNORMAL HIGH (ref 0.0–2.0)
BICARBONATE: 20.1 meq/L — AB (ref 21.0–28.0)
Patient temperature: 37
pCO2, Ven: 48 mmHg (ref 44.0–60.0)
pH, Ven: 7.23 — ABNORMAL LOW (ref 7.320–7.430)

## 2015-09-02 LAB — LACTATE DEHYDROGENASE: LDH: 437 U/L — AB (ref 98–192)

## 2015-09-02 MED ORDER — NOREPINEPHRINE 4 MG/250ML-% IV SOLN
INTRAVENOUS | Status: AC
  Administered 2015-09-02: 2 ug/min via INTRAVENOUS
  Filled 2015-09-02: qty 250

## 2015-09-02 MED ORDER — ACETAMINOPHEN 325 MG PO TABS
ORAL_TABLET | ORAL | Status: AC
  Administered 2015-09-02: 650 mg via ORAL
  Filled 2015-09-02: qty 2

## 2015-09-02 MED ORDER — SODIUM CHLORIDE 0.9 % IV BOLUS (SEPSIS)
1000.0000 mL | Freq: Once | INTRAVENOUS | Status: AC
  Administered 2015-09-02: 1000 mL via INTRAVENOUS

## 2015-09-02 MED ORDER — ACETAMINOPHEN 325 MG PO TABS
650.0000 mg | ORAL_TABLET | Freq: Four times a day (QID) | ORAL | Status: DC | PRN
Start: 2015-09-02 — End: 2015-09-09
  Administered 2015-09-03 – 2015-09-08 (×8): 650 mg via ORAL
  Filled 2015-09-02 (×7): qty 2

## 2015-09-02 MED ORDER — INSULIN ASPART 100 UNIT/ML ~~LOC~~ SOLN
0.0000 [IU] | SUBCUTANEOUS | Status: DC
  Administered 2015-09-02: 5 [IU] via SUBCUTANEOUS
  Administered 2015-09-02: 2 [IU] via SUBCUTANEOUS
  Administered 2015-09-02: 5 [IU] via SUBCUTANEOUS
  Administered 2015-09-02 – 2015-09-03 (×2): 1 [IU] via SUBCUTANEOUS
  Administered 2015-09-03 (×2): 2 [IU] via SUBCUTANEOUS
  Administered 2015-09-04: 3 [IU] via SUBCUTANEOUS
  Administered 2015-09-04: 5 [IU] via SUBCUTANEOUS
  Administered 2015-09-04 (×2): 3 [IU] via SUBCUTANEOUS
  Administered 2015-09-05 (×3): 2 [IU] via SUBCUTANEOUS
  Administered 2015-09-05: 1 [IU] via SUBCUTANEOUS
  Administered 2015-09-05: 5 [IU] via SUBCUTANEOUS
  Administered 2015-09-05: 1 [IU] via SUBCUTANEOUS
  Administered 2015-09-06: 2 [IU] via SUBCUTANEOUS
  Administered 2015-09-06 (×3): 1 [IU] via SUBCUTANEOUS
  Administered 2015-09-06: 2 [IU] via SUBCUTANEOUS
  Administered 2015-09-07 – 2015-09-09 (×6): 1 [IU] via SUBCUTANEOUS
  Filled 2015-09-02 (×2): qty 1
  Filled 2015-09-02 (×2): qty 2
  Filled 2015-09-02: qty 5
  Filled 2015-09-02 (×3): qty 1
  Filled 2015-09-02: qty 2
  Filled 2015-09-02 (×2): qty 5
  Filled 2015-09-02: qty 1
  Filled 2015-09-02: qty 5
  Filled 2015-09-02: qty 2
  Filled 2015-09-02: qty 3
  Filled 2015-09-02: qty 2
  Filled 2015-09-02: qty 1
  Filled 2015-09-02: qty 10
  Filled 2015-09-02: qty 3
  Filled 2015-09-02: qty 2
  Filled 2015-09-02: qty 1
  Filled 2015-09-02: qty 2
  Filled 2015-09-02 (×3): qty 1
  Filled 2015-09-02: qty 3
  Filled 2015-09-02: qty 1
  Filled 2015-09-02: qty 2
  Filled 2015-09-02: qty 1

## 2015-09-02 MED ORDER — ONDANSETRON HCL 4 MG/2ML IJ SOLN: 4.0000 mg | Freq: Four times a day (QID) | INTRAMUSCULAR | Status: DC | PRN

## 2015-09-02 MED ORDER — SODIUM CHLORIDE 0.9 % IV SOLN
INTRAVENOUS | Status: AC
  Administered 2015-09-02: 04:00:00 via INTRAVENOUS

## 2015-09-02 MED ORDER — ONDANSETRON HCL 4 MG PO TABS: 4.0000 mg | ORAL_TABLET | Freq: Four times a day (QID) | ORAL | Status: DC | PRN

## 2015-09-02 MED ORDER — ALBUTEROL SULFATE (2.5 MG/3ML) 0.083% IN NEBU: 3.0000 mL | INHALATION_SOLUTION | Freq: Four times a day (QID) | RESPIRATORY_TRACT | Status: DC | PRN

## 2015-09-02 MED ORDER — PIPERACILLIN-TAZOBACTAM 3.375 G IVPB
3.3750 g | Freq: Two times a day (BID) | INTRAVENOUS | Status: DC
  Administered 2015-09-02 – 2015-09-06 (×8): 3.375 g via INTRAVENOUS
  Filled 2015-09-02 (×11): qty 50

## 2015-09-02 MED ORDER — NOREPINEPHRINE BITARTRATE 1 MG/ML IV SOLN
0.0000 ug/min | INTRAVENOUS | Status: DC
  Administered 2015-09-02: 26 ug/min via INTRAVENOUS
  Administered 2015-09-03: 8 ug/min via INTRAVENOUS
  Filled 2015-09-02 (×2): qty 16

## 2015-09-02 MED ORDER — METRONIDAZOLE IN NACL 5-0.79 MG/ML-% IV SOLN
500.0000 mg | Freq: Three times a day (TID) | INTRAVENOUS | Status: DC
  Administered 2015-09-02 – 2015-09-03 (×4): 500 mg via INTRAVENOUS
  Filled 2015-09-02 (×8): qty 100

## 2015-09-02 MED ORDER — CLOPIDOGREL BISULFATE 75 MG PO TABS
75.0000 mg | ORAL_TABLET | Freq: Every day | ORAL | Status: DC
  Administered 2015-09-03 – 2015-09-09 (×7): 75 mg via ORAL
  Filled 2015-09-02 (×7): qty 1

## 2015-09-02 MED ORDER — HEPARIN SODIUM (PORCINE) 5000 UNIT/ML IJ SOLN
5000.0000 [IU] | Freq: Three times a day (TID) | INTRAMUSCULAR | Status: DC
  Administered 2015-09-02 – 2015-09-09 (×22): 5000 [IU] via SUBCUTANEOUS
  Filled 2015-09-02 (×21): qty 1

## 2015-09-02 MED ORDER — METRONIDAZOLE IN NACL 5-0.79 MG/ML-% IV SOLN
INTRAVENOUS | Status: AC
  Administered 2015-09-02: 500 mg via INTRAVENOUS
  Filled 2015-09-02: qty 100

## 2015-09-02 MED ORDER — OXYCODONE-ACETAMINOPHEN 5-325 MG PO TABS
1.0000 | ORAL_TABLET | ORAL | Status: DC | PRN
  Administered 2015-09-06 – 2015-09-09 (×2): 1 via ORAL
  Filled 2015-09-02 (×2): qty 1

## 2015-09-02 MED ORDER — ACETAMINOPHEN 325 MG PO TABS
650.0000 mg | ORAL_TABLET | Freq: Once | ORAL | Status: AC
  Administered 2015-09-02: 650 mg via ORAL

## 2015-09-02 MED ORDER — ASPIRIN EC 81 MG PO TBEC
81.0000 mg | DELAYED_RELEASE_TABLET | Freq: Every day | ORAL | Status: DC
  Administered 2015-09-03 – 2015-09-09 (×7): 81 mg via ORAL
  Filled 2015-09-02 (×7): qty 1

## 2015-09-02 MED ORDER — VANCOMYCIN HCL 10 G IV SOLR: 1.0000 g | Freq: Once | INTRAVENOUS | Status: DC

## 2015-09-02 MED ORDER — ALBUTEROL SULFATE (2.5 MG/3ML) 0.083% IN NEBU
2.5000 mg | INHALATION_SOLUTION | Freq: Four times a day (QID) | RESPIRATORY_TRACT | Status: DC
  Administered 2015-09-02 (×2): 2.5 mg via RESPIRATORY_TRACT
  Filled 2015-09-02 (×2): qty 3

## 2015-09-02 MED ORDER — SODIUM CHLORIDE 0.9 % IJ SOLN
3.0000 mL | Freq: Two times a day (BID) | INTRAMUSCULAR | Status: DC
  Administered 2015-09-02 – 2015-09-09 (×12): 3 mL via INTRAVENOUS

## 2015-09-02 MED ORDER — VANCOMYCIN HCL IN DEXTROSE 1-5 GM/200ML-% IV SOLN
INTRAVENOUS | Status: AC
  Administered 2015-09-02: 1000 mg via INTRAVENOUS
  Filled 2015-09-02: qty 200

## 2015-09-02 MED ORDER — ACETAMINOPHEN 650 MG RE SUPP
650.0000 mg | Freq: Four times a day (QID) | RECTAL | Status: DC | PRN
Start: 2015-09-02 — End: 2015-09-09

## 2015-09-02 MED ORDER — VANCOMYCIN HCL IN DEXTROSE 1-5 GM/200ML-% IV SOLN
1000.0000 mg | INTRAVENOUS | Status: DC
  Administered 2015-09-03: 1000 mg via INTRAVENOUS
  Filled 2015-09-02: qty 200

## 2015-09-02 MED ORDER — PIPERACILLIN-TAZOBACTAM 3.375 G IVPB
3.3750 g | Freq: Once | INTRAVENOUS | Status: AC
  Administered 2015-09-02: 3.375 g via INTRAVENOUS
  Filled 2015-09-02: qty 50

## 2015-09-02 MED ORDER — MOMETASONE FURO-FORMOTEROL FUM 200-5 MCG/ACT IN AERO
2.0000 | INHALATION_SPRAY | Freq: Two times a day (BID) | RESPIRATORY_TRACT | Status: DC
  Administered 2015-09-02 – 2015-09-05 (×6): 2 via RESPIRATORY_TRACT
  Filled 2015-09-02: qty 8.8

## 2015-09-02 MED ORDER — NOREPINEPHRINE 4 MG/250ML-% IV SOLN
0.0000 ug/min | INTRAVENOUS | Status: DC
  Administered 2015-09-02: 2 ug/min via INTRAVENOUS
  Filled 2015-09-02: qty 250

## 2015-09-02 MED ORDER — PANTOPRAZOLE SODIUM 40 MG PO TBEC
40.0000 mg | DELAYED_RELEASE_TABLET | Freq: Every day | ORAL | Status: DC
  Administered 2015-09-03 – 2015-09-06 (×4): 40 mg via ORAL
  Filled 2015-09-02 (×6): qty 1

## 2015-09-02 MED ORDER — VITAMIN D (ERGOCALCIFEROL) 1.25 MG (50000 UNIT) PO CAPS
50000.0000 [IU] | ORAL_CAPSULE | ORAL | Status: DC
  Administered 2015-09-04: 50000 [IU] via ORAL
  Filled 2015-09-02: qty 1

## 2015-09-02 MED ORDER — NOREPINEPHRINE BITARTRATE 1 MG/ML IV SOLN: 0.0000 ug/min | Freq: Once | INTRAVENOUS | Status: DC

## 2015-09-02 MED ORDER — METRONIDAZOLE IN NACL 5-0.79 MG/ML-% IV SOLN
500.0000 mg | Freq: Once | INTRAVENOUS | Status: AC
  Administered 2015-09-02: 500 mg via INTRAVENOUS

## 2015-09-02 MED ORDER — VANCOMYCIN HCL IN DEXTROSE 1-5 GM/200ML-% IV SOLN
1000.0000 mg | Freq: Once | INTRAVENOUS | Status: AC
Start: 2015-09-02 — End: 2015-09-02
  Administered 2015-09-02: 1000 mg via INTRAVENOUS

## 2015-09-02 NOTE — ED Notes (Signed)
Patient resting in bed with eyes closed.  Lights dimmed.  Daughter at bedside made aware of room assignment and plan to transport soon.  Patient has not been pulling at lines since last check and has been asleep per daughter. VS improving.

## 2015-09-02 NOTE — Consult Note (Signed)
WOC wound consult note completed by [facility] technology Reason for Consult: left heel and left toe wound.   Bedside nurse reports weak palpable pulse.  Wound type:Unstagable Pressure injury left achilles area Arterial ulcer left 5th toe Pressure Ulcer POA: Yes Measurement: left heel 4cm x 4cm x 0 Wound bed:100% dry stable eschar over the left achilles area Dry gangrene left 5th toe Drainage (amount, consistency, odor) none Periwound:intact, some erythema at achilles area Dressing procedure/placement/frequency: Open to air, or dry dressing only. Prevalon boot to offload the heel/achilles area. Vascular consult pending.   Discussed POC with family and bedside nurse.  Re consult if needed, will not follow at this time. Thanks  Fares [employer], CWOCN [phone])

## 2015-09-02 NOTE — Progress Notes (Addendum)
ANTIBIOTIC CONSULT NOTE - INITIAL  Pharmacy Consult for Zosyn/Vancomycin Indication: rule out sepsis  No Known Allergies  Patient Measurements: Weight: 165 lb 11.2 oz (75.161 kg) Adjusted Body Weight: 58.7 kg  Vital Signs: Temp: 98 F (36.7 C) (09/16 2330) Temp Source: Oral (09/16 2330) BP: 69/41 mmHg (09/17 0606) Pulse Rate: 69 (09/17 0606) Intake/Output from previous day:   Intake/Output from this shift:    Labs:  Recent Labs  09/01/15 2341  WBC 29.0*  HGB 9.7*  PLT 312  CREATININE 3.16*   Estimated Creatinine Clearance: 10.5 mL/min (by C-G formula based on Cr of 3.16). No results for input(s): VANCOTROUGH, VANCOPEAK, VANCORANDOM, GENTTROUGH, GENTPEAK, GENTRANDOM, TOBRATROUGH, TOBRAPEAK, TOBRARND, AMIKACINPEAK, AMIKACINTROU, AMIKACIN in the last 72 hours.   Microbiology: No results found for this or any previous visit (from the past 720 hour(s)).  Medical History: Past Medical History  Diagnosis Date  . Abnormal heart rhythms   . Diabetes mellitus without complication   . Coronary artery disease   . Hyperlipidemia   . Hypertension   . Chronic kidney disease   . Varicose veins   . Peripheral vascular disease   . Anemia   . Arthritis     Medications:  Infusions:  . sodium chloride 100 mL/hr at 09/02/15 0341  . metronidazole    . norepinephrine 10 mcg/min (09/02/15 4098)   Assessment: 89 yof cc AMS with hx CKD III. Has not been eating/drinking well for past several days per daughter. LLE stent for PAD in August then developed wound over left heel and dry gangrene of left fifth toe was on oral abx for 21 days which she completed 2 days ago. In ED WBC 29, SCr 3.16 BUN 48, LA 4.1. CXR atelectasis vs PNA. Starting Vancomycin/Zosyn/Flagyl.   Vd 41.1 L, Ke 0.013 hr-1, T1/2 52.7 hr, predicted trough 16 mcg/mL  Goal of Therapy:  Vancomycin trough level 15-20 mcg/ml  Plan:  Expected duration 7 days with resolution of temperature and/or normalization of WBC.  CrCl approximately 10 mL/min. Zosyn 3.375 gm IV Q12H EI and vancomycin 1 gm IV Q72H with stacked dosing, the second dose 36 hours after first. Hx CKD III so renal function may improve over the next few days and we will have to adjust antibiotic dosing empirically if that happens. Trough scheduled for before third dose, which will not be quite steady state. Will reassess renal function daily.   Carola Frost, Pharm.D. Clinical Pharmacist  09/02/2015,6:22 AM

## 2015-09-02 NOTE — ED Notes (Signed)
Patient transported to CT 

## 2015-09-02 NOTE — ED Notes (Signed)
Patient transported from CT 

## 2015-09-02 NOTE — ED Notes (Signed)
Blood collected for AM admission labs.

## 2015-09-02 NOTE — ED Notes (Signed)
Dr. Betti Cruz notified of pts BP continuing to be in the 70's with levo.

## 2015-09-02 NOTE — Progress Notes (Signed)
Patient ID: [name], female   DOB: 07-Nov-1921, 81 y.o.   MRN: [id number] [facility] PROGRESS NOTE  PCP: Lavetta Nielsen, MD  HPI/Subjective: Patient lethargic but does answer some yes or no questions. She was on antibiotics for 3 weeks recently after procedures on her lower extremity. Has been having some diarrhea.  Objective: Filed Vitals:   09/02/15 0900  BP: 108/96  Pulse: 71  Temp: 99 F (37.2 C)  Resp: 20   No intake or output data in the 24 hours ending 09/02/15 1259 Filed Weights   09/02/15 0245  Weight: 75.161 kg (165 lb 11.2 oz)    ROS: Review of Systems  Constitutional: Negative for fever and chills.  Eyes: Negative for blurred vision.  Respiratory: Negative for cough and shortness of breath.   Cardiovascular: Negative for chest pain.  Gastrointestinal: Positive for abdominal pain and diarrhea. Negative for nausea, vomiting and constipation.  Genitourinary: Negative for dysuria.  Musculoskeletal: Negative for joint pain.  Neurological: Negative for dizziness and headaches.   Exam: Physical Exam  Constitutional: She appears lethargic.  HENT:  Nose: No mucosal edema.  Mouth/Throat: No oropharyngeal exudate or posterior oropharyngeal edema.  Eyes: Conjunctivae, EOM and lids are normal. Pupils are equal, round, and reactive to light.  Neck: No JVD present. Carotid bruit is not present. No edema present. No thyroid mass and no thyromegaly present.  Cardiovascular: S1 normal and S2 normal.  Exam reveals no gallop.   No murmur heard. Pulses:      Dorsalis pedis pulses are 2+ on the right side, and 2+ on the left side.  Respiratory: No respiratory distress. She has no wheezes. She has no rhonchi. She has rales in the right lower field and the left lower field.  GI: Soft. Bowel sounds are normal. There is no tenderness.  Musculoskeletal:       Right ankle: She exhibits swelling.       Left ankle: She exhibits swelling.  Lymphadenopathy:     She has no cervical adenopathy.  Neurological: She appears lethargic.  Moves her arms on her own  Skin: Skin is warm. No rash noted. Nails show no clubbing.  Blackened fifth toe left foot  Psychiatric:  Lethargic    Data Reviewed: Basic Metabolic Panel:  Recent Labs Lab 09/01/15 2341 09/02/15 0612  NA 129* 129*  K 4.8 5.1  CL 91* 97*  CO2 23 21*  GLUCOSE 260* 208*  BUN 48* 47*  CREATININE 3.16* 2.93*  CALCIUM 8.3* 7.7*   Liver Function Tests:  Recent Labs Lab 09/01/15 2341  AST 28  ALT 16  ALKPHOS 78  BILITOT 0.8  PROT 6.4*  ALBUMIN 3.6    Recent Labs Lab 09/01/15 2341  LIPASE 41   CBC:  Recent Labs Lab 09/01/15 2341 09/02/15 0612  WBC 29.0* 28.7*  NEUTROABS 22.0*  --   HGB 9.7* 9.0*  HCT 30.0* 27.7*  MCV 93.5 92.9  PLT 312 286   Cardiac Enzymes:  Recent Labs Lab 09/01/15 2341 09/02/15 0612  TROPONINI 0.05* 0.05*    CBG:  Recent Labs Lab 09/02/15 0917  GLUCAP 270*    Recent Results (from the past 240 hour(s))  Urine culture     Status: None (Preliminary result)   Collection Time: 09/01/15 11:41 PM  Result Value Ref Range Status   Specimen Description URINE, RANDOM  Final   Special Requests NONE  Final   Culture NO GROWTH < 12 HOURS  Final   Report Status PENDING  Incomplete  Stool culture     Status: None (Preliminary result)   Collection Time: 09/02/15  9:10 AM  Result Value Ref Range Status   Specimen Description STOOL  Final   Special Requests NONE  Final   Culture NO CAMPYLOBACTER DETECTED  Final   Report Status PENDING  Incomplete  MRSA PCR Screening     Status: None   Collection Time: 09/02/15  9:10 AM  Result Value Ref Range Status   MRSA by PCR NEGATIVE NEGATIVE Final    Comment:        The GeneXpert MRSA Assay (FDA approved for NASAL specimens only), is one component of a comprehensive MRSA colonization surveillance program. It is not intended to diagnose MRSA infection nor to guide or monitor treatment  for MRSA infections.      Studies: Ct Abdomen Pelvis Wo Contrast  09/02/2015   CLINICAL DATA:  Weakness, diarrhea, firm abdomen.  EXAM: CT ABDOMEN AND PELVIS WITHOUT CONTRAST  TECHNIQUE: Multidetector CT imaging of the abdomen and pelvis was performed following the standard protocol without IV contrast.  COMPARISON:  Report from CT abdomen/pelvis 03/07/2008, images not available.  FINDINGS: The heart is enlarged. There is elevation of the left hemidiaphragm. Left basilar pleural thickening with small left pleural effusion. There is adjacent left basilar opacity, likely atelectasis.  Patient motion artifact and streak artifact from overlying arms down positioning limits assessment.  Stomach is mildly distended. Fluid-filled prominent bowel loops in the lower abdomen and pelvis without transition point. The colon is decompressed. Minimal diverticulosis of the distal colon without diverticulitis. No pneumatosis, perforation or free air.  Calcifications within both renal hila, likely combination nonobstructing stones and vascular calcifications. No hydronephrosis. Evaluation of the remaining solid and hollow viscera is limited given lack of contrast. The unenhanced liver and gallbladder are unremarkable. Pancreas is atrophic. A 1.7 cm lobular soft tissue focus in the region of the pancreatic tail, likely a splenule and described previously. Scattered splenic granuloma. Mild thickening of both adrenal glands without discrete nodule.  There is dense atherosclerosis of the abdominal aorta and its branches. No aneurysm.  Urinary bladder is decompressed. Uterus is not seen, presumably surgically absent. Within the left adnexa is a a fluid density structure with internal septation measuring 5.3 x 3.4 cm. This is difficult to delineate from the adjacent fluid-filled bowel loops. By report this is unchanged from prior exam.  Diffuse degenerative change throughout the spine. Minimal compression deformity superior endplate  of L2, appears chronic.  IMPRESSION: 1. Prominent fluid-filled small bowel loops without transition point, in a pattern consistent with enteritis. An early small bowel obstruction could have a similar appearance. 2. Dense atherosclerosis of the abdominal aorta and its branches without aneurysm. 3. Small left pleural effusion with adjacent left lower lobe opacity, likely atelectasis. 4. Cystic left adnexal lesion measures 5.3 x 3.4 cm, difficult to delineate from adjacent fluid-filled bowel loops, however this is unchanged compared to report from exam 7 years prior. Imaging stability suggests a benign etiology, however images not available for direct comparison. After resolution of acute illness, pelvic ultrasound could be considered for characterization.   Electronically Signed   By: Rubye Oaks M.D.   On: 09/02/2015 01:34   Dg Chest 2 View  09/02/2015   CLINICAL DATA:  Weakness.  Altered mental status.  Diarrhea.  EXAM: CHEST  2 VIEW  COMPARISON:  Frontal and lateral views 08/13/2007  FINDINGS: Progressive elevation of left hemidiaphragm. There is adjacent left basilar opacity. The right lung  is clear. Cardiomegaly and tortuous atherosclerotic aorta, unchanged. No large pleural effusion. No pneumothorax. No acute osseous abnormalities are seen.  IMPRESSION: Left basilar opacity. This is adjacent to elevated left hemidiaphragm. This may reflect compressive atelectasis versus pneumonia. Cardiomegaly is stable.   Electronically Signed   By: Rubye Oaks M.D.   On: 09/02/2015 00:17   Ct Head Wo Contrast  09/02/2015   CLINICAL DATA:  Weakness and altered mental status.  EXAM: CT HEAD WITHOUT CONTRAST  TECHNIQUE: Contiguous axial images were obtained from the base of the skull through the vertex without intravenous contrast.  COMPARISON:  CT 11/06/2010  FINDINGS: Generalized atrophy, most significant in the high bifrontal regions. Mild chronic small vessel ischemic change. No intracranial hemorrhage, mass  effect, or midline shift. No hydrocephalus. The basilar cisterns are patent. No evidence of territorial infarct. No intracranial fluid collection. Atherosclerotic calcifications of the skullbase vasculature. Calvarium is intact. Included paranasal sinuses and mastoid air cells are well aerated.  IMPRESSION: Atrophy and chronic small vessel ischemic change. No acute intracranial abnormality.   Electronically Signed   By: Rubye Oaks M.D.   On: 09/02/2015 01:21    Scheduled Meds: . albuterol  2.5 mg Nebulization Q6H  . aspirin EC  81 mg Oral Daily  . clopidogrel  75 mg Oral Daily  . heparin  5,000 Units Subcutaneous 3 times per day  . insulin aspart  0-9 Units Subcutaneous 6 times per day  . metronidazole  500 mg Intravenous Q8H  . mometasone-formoterol  2 puff Inhalation BID  . pantoprazole  40 mg Oral Daily  . piperacillin-tazobactam (ZOSYN)  IV  3.375 g Intravenous Q12H  . sodium chloride  3 mL Intravenous Q12H  . [START ON 09/03/2015] vancomycin  1,000 mg Intravenous Q72H  . [START ON 09/04/2015] Vitamin D (Ergocalciferol)  50,000 Units Oral Weekly   Continuous Infusions: . sodium chloride 100 mL/hr at 09/02/15 0341  . norepinephrine (LEVOPHED) Adult infusion 30 mcg/min (09/02/15 1249)    Assessment/Plan:  1. Septic shock. Patient is on levo fed in order to maintain blood pressure. I had ordered another IV fluid bolus. Blood pressure is been very variable. Source could be C. difficile colitis versus pneumonia. Patient is on IV vancomycin and Zosyn and Flagyl. If C. difficile comes back positive I'll switch over to by mouth vancomycin. 2. Acute renal failure on chronic kidney disease stage III- continue IV fluid hydration and continue to monitor. 3. Peripheral vascular disease, with blackened toe on left foot- on aspirin and Plavix 4. Diabetes mellitus type 2- patient on sliding scale 5. Anemia- watch hemoglobin closely with IV fluid hydration 6. History of coronary artery  disease 7. Hyponatremia- likely with dehydration continue normal saline. 8. Elevated troponin- demand ischemia from septic shock.  Code Status:     Code Status Orders        Start     Ordered   09/02/15 [phone]  Do not attempt resuscitation (DNR)   Continuous    Question Answer Comment  In the event of cardiac or respiratory ARREST Do not call a "code blue"   In the event of cardiac or respiratory ARREST Do not perform Intubation, CPR, defibrillation or ACLS   In the event of cardiac or respiratory ARREST Use medication by any route, position, wound care, and other measures to relive pain and suffering. May use oxygen, suction and manual treatment of airway obstruction as needed for comfort.   Comments RN may pronounce death  09/02/15 1610     Family Communication: Daughter at the bedside Disposition Plan: To be determined  Consultants:  Gastroenterology  Antibiotics:  IV vancomycin  IV Zosyn  IV Flagyl  Time spent: 35 minutes critical care time spent  Alford Highland  [facility]

## 2015-09-02 NOTE — Consult Note (Signed)
GI Inpatient Consult Note  Reason for Consult:Diarrhea   Attending Requesting Consult: Dr. Hilton Sinclair  History of Present Illness: [name] is a 82 y.o. female who has been on antibiotics for a few weeks due to circulation induced ulcerated infected wounds to left heal with gangrene, on a 21 day course.  She has been having diarrhea 2-5 times a day per her daughter.  She was admitted and had CT of abd and showed changes c/w enteritis of small bowel.  C. Diff is pending.  She had fem pop bypass, dementia, COPD, Chronic anemia, dementia, atelectasis vs pneumonia.  She is not taking oral well at all.  On iv flagyl and zosyn.  Past Medical History:  Past Medical History  Diagnosis Date  . Abnormal heart rhythms   . Diabetes mellitus without complication   . Coronary artery disease   . Hyperlipidemia   . Hypertension   . Chronic kidney disease   . Varicose veins   . Peripheral vascular disease   . Anemia   . Arthritis     Problem List: Patient Active Problem List   Diagnosis Date Noted  . Acute encephalopathy 09/02/2015  . Sepsis 09/02/2015  . Acute-on-chronic kidney injury 09/02/2015  . Enteritis 09/02/2015  . Hyponatremia 09/02/2015  . Elevated troponin 09/02/2015  . DM (diabetes mellitus) 09/02/2015  . HTN (hypertension) 09/02/2015  . Dementia 09/02/2015  . PAD (peripheral artery disease) 09/02/2015  . Anemia 09/02/2015    Past Surgical History: Past Surgical History  Procedure Laterality Date  . Appendectomy    . Abdominal hysterectomy    . Cataract surgery    . Peripheral vascular catheterization Left 07/13/2015    Procedure: Lower Extremity Angiography;  Surgeon: Annice Needy, MD;  Location: [facility] INVASIVE CV LAB;  Service: Cardiovascular;  Laterality: Left;  . Peripheral vascular catheterization  07/13/2015    Procedure: Lower Extremity Intervention;  Surgeon: Annice Needy, MD;  Location: [facility] INVASIVE CV LAB;  Service: Cardiovascular;;  . Peripheral vascular  catheterization Left 07/24/2015    Procedure: Lower Extremity Angiography;  Surgeon: Annice Needy, MD;  Location: [facility] INVASIVE CV LAB;  Service: Cardiovascular;  Laterality: Left;  . Peripheral vascular catheterization  07/24/2015    Procedure: Lower Extremity Intervention;  Surgeon: Annice Needy, MD;  Location: [facility] INVASIVE CV LAB;  Service: Cardiovascular;;    Allergies: No Known Allergies  Home Medications: Prescriptions prior to admission  Medication Sig Dispense Refill Last Dose  . albuterol (PROVENTIL HFA;VENTOLIN HFA) 108 (90 BASE) MCG/ACT inhaler Inhale into the lungs every 6 (six) hours as needed for wheezing or shortness of breath.   Past Week at Unknown time  . albuterol (PROVENTIL) (2.5 MG/3ML) 0.083% nebulizer solution Take 2.5 mg by nebulization every 6 (six) hours as needed for wheezing or shortness of breath.   Past Week at Unknown time  . amLODipine (NORVASC) 5 MG tablet Take 5 mg by mouth daily.   09/01/2015 at Unknown time  . atenolol (TENORMIN) 50 MG tablet Take 50 mg by mouth daily.   09/01/2015 at 1100  . clopidogrel (PLAVIX) 75 MG tablet Take 75 mg by mouth daily.   09/01/2015 at Unknown time  . ergocalciferol (VITAMIN D2) 50000 UNITS capsule Take 50,000 Units by mouth every 30 (thirty) days.    Past Month at Unknown time  . Fluticasone-Salmeterol (ADVAIR) 500-50 MCG/DOSE AEPB Inhale 1 puff into the lungs 2 (two) times daily.   09/01/2015 at Unknown time  . glipiZIDE (GLUCOTROL XL) 10  MG 24 hr tablet Take 5 mg by mouth 2 (two) times daily.    09/01/2015 at Unknown time  . loratadine (CLARITIN) 10 MG tablet Take 10 mg by mouth daily as needed for allergies.   Past Week at Unknown time  . omeprazole (PRILOSEC) 20 MG capsule Take 20 mg by mouth daily.   09/01/2015 at Unknown time  . oxyCODONE-acetaminophen (PERCOCET/ROXICET) 5-325 MG per tablet Take 1 tablet by mouth every 4 (four) hours as needed for severe pain (every 4-6hours for pain).   09/01/2015 at Unknown time  . quinapril  (ACCUPRIL) 40 MG tablet Take 40 mg by mouth at bedtime.   09/01/2015 at Unknown time   Home medication reconciliation was completed with the patient.   Scheduled Inpatient Medications:   . albuterol  2.5 mg Nebulization Q6H  . aspirin EC  81 mg Oral Daily  . clopidogrel  75 mg Oral Daily  . heparin  5,000 Units Subcutaneous 3 times per day  . insulin aspart  0-9 Units Subcutaneous 6 times per day  . metronidazole  500 mg Intravenous Q8H  . mometasone-formoterol  2 puff Inhalation BID  . pantoprazole  40 mg Oral Daily  . piperacillin-tazobactam (ZOSYN)  IV  3.375 g Intravenous Q12H  . sodium chloride  3 mL Intravenous Q12H  . [START ON 09/03/2015] vancomycin  1,000 mg Intravenous Q72H  . [START ON 09/04/2015] Vitamin D (Ergocalciferol)  50,000 Units Oral Weekly    Continuous Inpatient Infusions:   . sodium chloride 100 mL/hr at 09/02/15 0341  . norepinephrine (LEVOPHED) Adult infusion 26 mcg/min (09/02/15 0947)    PRN Inpatient Medications:  acetaminophen **OR** acetaminophen, albuterol, ondansetron **OR** ondansetron (ZOFRAN) IV, oxyCODONE-acetaminophen  Family History: family history includes CVA in her brother; Cancer in her brother and sister; Kidney disease in her mother.  The patient's family history is negative for inflammatory bowel disorders, GI malignancy, or solid organ transplantation.  Social History:   reports that she has quit smoking. Her smoking use included Cigarettes. She has a 42 pack-year smoking history. Her smokeless tobacco use includes Snuff. She reports that she drinks about 1.2 oz of alcohol per week. The patient denies ETOH, tobacco, or drug use.   Review of Systems:Un able to do because of severe obtundation, non responsive verbal  Physical Examination: BP 108/96 mmHg  Pulse 71  Temp(Src) 99 F (37.2 C) (Axillary)  Resp 20  Wt 75.161 kg (165 lb 11.2 oz)  SpO2 99% Gen: NAD, non verbal. HEENT: No facial trauma Neck: supple,  or thyromegaly Chest:  CTA bilaterally, no wheezes, crackles, or other adventitious sounds CV: RRR, no m/g/c/r Abd: soft, NT, ND, very few bowel sounds.; no HSM, guarding, ridigity, or rebound tenderness Ext: deep dry ulcer left heel., Skin: no rash or lesions noted   Data: Lab Results  Component Value Date   WBC 28.7* 09/02/2015   HGB 9.0* 09/02/2015   HCT 27.7* 09/02/2015   MCV 92.9 09/02/2015   PLT 286 09/02/2015    Recent Labs Lab 09/01/15 2341 09/02/15 0612  HGB 9.7* 9.0*   Lab Results  Component Value Date   NA 129* 09/02/2015   K 5.1 09/02/2015   CL 97* 09/02/2015   CO2 21* 09/02/2015   BUN 47* 09/02/2015   CREATININE 2.93* 09/02/2015   Lab Results  Component Value Date   ALT 16 09/01/2015   AST 28 09/01/2015   ALKPHOS 78 09/01/2015   BILITOT 0.8 09/01/2015   No results for input(s): APTT, INR,  PTT in the last 168 hours. Assessment/Plan: Ms. Copus is a 81 y.o. female   Recommendations:Await stool studies.  IV flagyl will cross into colon but vancomycin will not.  Dificid could be crushed and given with liquid by mouth, vancomycin can be given oral also but she may not be coherent enough to swallow. If unable to take oral vancomycin or Dificid  And she does have C. Diff she should have consideration for a small bore feeding tube for medications.  Thank you for the consult. Please call with questions or concerns.  Lynnae Prude, MD

## 2015-09-02 NOTE — Consult Note (Signed)
[facility] Vascular Consult Note  MRN : [id number]  [name] is a 81 y.o. (December 12, 1921) female who presents with chief complaint of   Chief Complaint  Patient presents with  . Altered Mental Status  .  History of Present Illness:  The patient is a 81 year old female well known to our service. She is s/p a left lower extremity femoropopliteal bypass in the past. She has undergone a left lower extremity angiogram with stent placement for non-healing heel ulceration in 07/2015. She was just seen in our office on Tuesday 08/29/15 in follow up.   The patient has a history of diabetes mellitus type 2, CKD stage III, hypertension, peripheral arterial disease status post femoropopliteal bypass, dementia, COPD, chronic anemia, on DO NOT RESUSCITATE status brought in with the complaints of decreased mental status since yesterday. According to the patient's daughters, she was not feeling well and not eating or drinking well for the past few days and was noted to have decreased mental status yesterday hence brought to the emergency room for further evaluation.  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   Patient admitted to ICU for severe sepsis, acute encephalopathy and possible C. Diff.  Vascular surgery consulted for recommendations.   Current Facility-Administered Medications  Medication Dose Route Frequency Ulises Wolfinger Last Rate Last Dose  . 0.9 %  sodium chloride infusion   Intravenous Continuous Crissie Figures, MD 100 mL/hr at 09/02/15 0341    . acetaminophen (TYLENOL) tablet 650 mg  650 mg Oral Q6H PRN Crissie Figures, MD       Or  . acetaminophen (TYLENOL) suppository 650 mg  650 mg Rectal Q6H PRN Crissie Figures, MD      . albuterol (PROVENTIL) (2.5 MG/3ML) 0.083% nebulizer solution 2.5 mg  2.5 mg Nebulization Q6H Crissie Figures, MD   2.5 mg at 09/02/15  0800  . albuterol (PROVENTIL) (2.5 MG/3ML) 0.083% nebulizer solution 3 mL  3 mL Inhalation Q6H PRN Crissie Figures, MD      . aspirin EC tablet 81 mg  81 mg Oral Daily Crissie Figures, MD   81 mg at 09/02/15 0932  . clopidogrel (PLAVIX) tablet 75 mg  75 mg Oral Daily Crissie Figures, MD   75 mg at 09/02/15 0933  . heparin injection 5,000 Units  5,000 Units Subcutaneous 3 times per day Crissie Figures, MD   5,000 Units at 09/02/15 0946  . insulin aspart (novoLOG) injection 0-9 Units  0-9 Units Subcutaneous 6 times per day Crissie Figures, MD   5 Units at 09/02/15 1255  . metroNIDAZOLE (FLAGYL) IVPB 500 mg  500 mg Intravenous Q8H Crissie Figures, MD 100 mL/hr at 09/02/15 0735 500 mg at 09/02/15 0735  . mometasone-formoterol (DULERA) 200-5 MCG/ACT inhaler 2 puff  2 puff Inhalation BID Crissie Figures, MD   2 puff at 09/02/15 0900  . norepinephrine (LEVOPHED) 16 mg in dextrose 5 % 250 mL (0.064 mg/mL) infusion  0-40 mcg/min Intravenous Titrated Alford Highland, MD 28.1 mL/hr at 09/02/15 1249 30 mcg/min at 09/02/15 1249  . ondansetron (ZOFRAN) tablet 4 mg  4 mg Oral Q6H PRN Crissie Figures, MD       Or  . ondansetron [facility]) injection 4 mg  4 mg Intravenous Q6H PRN Crissie Figures, MD      .  oxyCODONE-acetaminophen (PERCOCET/ROXICET) 5-325 MG per tablet 1 tablet  1 tablet Oral Q4H PRN Crissie Figures, MD      . pantoprazole (PROTONIX) EC tablet 40 mg  40 mg Oral Daily Crissie Figures, MD   40 mg at 09/02/15 0933  . piperacillin-tazobactam (ZOSYN) IVPB 3.375 g  3.375 g Intravenous Q12H Crissie Figures, MD   3.375 g at 09/02/15 1255  . sodium chloride 0.9 % injection 3 mL  3 mL Intravenous Q12H Crissie Figures, MD   3 mL at 09/02/15 0948  . [START ON 09/03/2015] vancomycin (VANCOCIN) IVPB 1000 mg/200 mL premix  1,000 mg Intravenous Q72H Crissie Figures, MD      . Melene Muller ON 09/04/2015] Vitamin D (Ergocalciferol) (DRISDOL) capsule 50,000 Units  50,000 Units Oral Weekly Crissie Figures, MD         Past Medical History  Diagnosis Date  . Abnormal heart rhythms   . Diabetes mellitus without complication   . Coronary artery disease   . Hyperlipidemia   . Hypertension   . Chronic kidney disease   . Varicose veins   . Peripheral vascular disease   . Anemia   . Arthritis     Past Surgical History  Procedure Laterality Date  . Appendectomy    . Abdominal hysterectomy    . Cataract surgery    . Peripheral vascular catheterization Left 07/13/2015    Procedure: Lower Extremity Angiography;  Surgeon: Annice Needy, MD;  Location: [facility] INVASIVE CV LAB;  Service: Cardiovascular;  Laterality: Left;  . Peripheral vascular catheterization  07/13/2015    Procedure: Lower Extremity Intervention;  Surgeon: Annice Needy, MD;  Location: [facility] INVASIVE CV LAB;  Service: Cardiovascular;;  . Peripheral vascular catheterization Left 07/24/2015    Procedure: Lower Extremity Angiography;  Surgeon: Annice Needy, MD;  Location: [facility] INVASIVE CV LAB;  Service: Cardiovascular;  Laterality: Left;  . Peripheral vascular catheterization  07/24/2015    Procedure: Lower Extremity Intervention;  Surgeon: Annice Needy, MD;  Location: [facility] INVASIVE CV LAB;  Service: Cardiovascular;;    Social History Social History  Substance Use Topics  . Smoking status: Former Smoker -- 1.00 packs/day for 42 years    Types: Cigarettes  . Smokeless tobacco: Current User    Types: Snuff  . Alcohol Use: 1.2 oz/week    2 Shots of liquor per week    Family History Family History  Problem Relation Age of Onset  . Kidney disease Mother   . Cancer Sister   . Cancer Brother   . CVA Brother     No Known Allergies   REVIEW OF SYSTEMS (Negative unless checked)  **Information received from daughter at bedside as patient was very drowsy and would not answer questions**  Constitutional: [] Weight loss  [] Fever  [] Chills Cardiac: [] Chest pain   [] Chest pressure   [] Palpitations   [] Shortness of breath when laying flat    [] Shortness of breath at rest   [] Shortness of breath with exertion. Vascular:  [x] Pain in legs with walking   [] Pain in legs at rest   [] Pain in legs when laying flat   [] Claudication   [x] Pain in feet when walking  [] Pain in feet at rest  [] Pain in feet when laying flat   [] History of DVT   [] Phlebitis   [x] Swelling in legs   [] Varicose veins   [x] Non-healing ulcers Pulmonary:   [] Uses home oxygen   [] Productive cough   [] Hemoptysis   [] Wheeze  [] COPD   []   Asthma Neurologic:  [x] Dizziness  [] Blackouts   [] Seizures   [] History of stroke   [] History of TIA  [] Aphasia   [] Temporary blindness   [] Dysphagia   [x] Weakness or numbness in arms   [x] Weakness or numbness in legs Musculoskeletal:  [] Arthritis   [] Joint swelling   [] Joint pain   [] Low back pain Hematologic:  [] Easy bruising  [] Easy bleeding   [] Hypercoagulable state   [] Anemic  [] Hepatitis Gastrointestinal:  [] Blood in stool   [] Vomiting blood  [] Gastroesophageal reflux/heartburn   [] Difficulty swallowing. Genitourinary:  [x] Chronic kidney disease   [] Difficult urination  [] Frequent urination  [] Burning with urination   [] Blood in urine Skin:  [] Rashes   [x] Ulcers   [] Wounds Psychological:  [] History of anxiety   []  History of major depression.  Physical Examination  Filed Vitals:   09/02/15 1000 09/02/15 1100 09/02/15 1200 09/02/15 1300  BP: 113/74 141/87 160/75 143/93  Pulse: 72 73 72 74  Temp:   98.9 F (37.2 C)   TempSrc:   Axillary   Resp: 14 15 12 14   Weight:      SpO2: 96% 97% 96% 97%   Body mass index is 31.32 kg/(m^2).   Gen: Very drowsy, No acute distress Head: Mackey/AT, No temporalis wasting. Prominent temp pulse not noted. Ear/Nose/Throat: nares w/o erythema or drainage, oropharynx w/o Erythema/Exudate Eyes: PERRLA, EOMI.  Neck: Supple, no nuchal rigidity.  No bruit or JVD.  Pulmonary:  Good air movement, clear to auscultation bilaterally.  Cardiac: RRR, normal S1, S2, no Murmurs, rubs or gallops. Vascular:  Vessel  Right Left  Radial Palpable Palpable  Ulnar Palpable Palpable  Brachial Palpable Palpable  Carotid Palpable, without bruit Palpable, without bruit  Aorta Not palpable N/A  Femoral Palpable Palpable  Popliteal Palpable Palpable  PT Palpable Palpable  DP Palpable Palpable   Left Lower Extremity: 3cm x 2cm black eschar noted on heel, no drainage, no signs of infection, no erythema noted, no cellulitis noted. Fifth toe with dry gangrene. Left calf mild-moderate edema  Gastrointestinal: soft, non-tender/non-distended. No guarding/reflex. No masses. Musculoskeletal: No deformity or atrophy.  Neurologic: patient is almost non-responsive - hard evaluate Dermatologic: No rashes. Lymph : No Cervical, Axillary, or Inguinal lymphadenopathy.  CBC Lab Results  Component Value Date   WBC 28.7* 09/02/2015   HGB 9.0* 09/02/2015   HCT 27.7* 09/02/2015   MCV 92.9 09/02/2015   PLT 286 09/02/2015   BMET    Component Value Date/Time   NA 129* 09/02/2015 0612   K 5.1 09/02/2015 0612   CL 97* 09/02/2015 0612   CO2 21* 09/02/2015 0612   GLUCOSE 208* 09/02/2015 0612   BUN 47* 09/02/2015 0612   CREATININE 2.93* 09/02/2015 0612   CALCIUM 7.7* 09/02/2015 0612   GFRNONAA 13* 09/02/2015 0612   GFRAA 15* 09/02/2015 0612   Estimated Creatinine Clearance: 11.4 mL/min (by C-G formula based on Cr of 2.93).  COAG No results found for: INR, PROTIME  Radiology Ct Abdomen Pelvis Wo Contrast  09/02/2015   CLINICAL DATA:  Weakness, diarrhea, firm abdomen.  EXAM: CT ABDOMEN AND PELVIS WITHOUT CONTRAST  TECHNIQUE: Multidetector CT imaging of the abdomen and pelvis was performed following the standard protocol without IV contrast.  COMPARISON:  Report from CT abdomen/pelvis 03/07/2008, images not available.  FINDINGS: The heart is enlarged. There is elevation of the left hemidiaphragm. Left basilar pleural thickening with small left pleural effusion. There is adjacent left basilar opacity, likely atelectasis.   Patient motion artifact and streak artifact from overlying arms down  positioning limits assessment.  Stomach is mildly distended. Fluid-filled prominent bowel loops in the lower abdomen and pelvis without transition point. The colon is decompressed. Minimal diverticulosis of the distal colon without diverticulitis. No pneumatosis, perforation or free air.  Calcifications within both renal hila, likely combination nonobstructing stones and vascular calcifications. No hydronephrosis. Evaluation of the remaining solid and hollow viscera is limited given lack of contrast. The unenhanced liver and gallbladder are unremarkable. Pancreas is atrophic. A 1.7 cm lobular soft tissue focus in the region of the pancreatic tail, likely a splenule and described previously. Scattered splenic granuloma. Mild thickening of both adrenal glands without discrete nodule.  There is dense atherosclerosis of the abdominal aorta and its branches. No aneurysm.  Urinary bladder is decompressed. Uterus is not seen, presumably surgically absent. Within the left adnexa is a a fluid density structure with internal septation measuring 5.3 x 3.4 cm. This is difficult to delineate from the adjacent fluid-filled bowel loops. By report this is unchanged from prior exam.  Diffuse degenerative change throughout the spine. Minimal compression deformity superior endplate of L2, appears chronic.  IMPRESSION: 1. Prominent fluid-filled small bowel loops without transition point, in a pattern consistent with enteritis. An early small bowel obstruction could have a similar appearance. 2. Dense atherosclerosis of the abdominal aorta and its branches without aneurysm. 3. Small left pleural effusion with adjacent left lower lobe opacity, likely atelectasis. 4. Cystic left adnexal lesion measures 5.3 x 3.4 cm, difficult to delineate from adjacent fluid-filled bowel loops, however this is unchanged compared to report from exam 7 years prior. Imaging stability  suggests a benign etiology, however images not available for direct comparison. After resolution of acute illness, pelvic ultrasound could be considered for characterization.   Electronically Signed   By: Rubye Oaks M.D.   On: 09/02/2015 01:34   Dg Chest 2 View  09/02/2015   CLINICAL DATA:  Weakness.  Altered mental status.  Diarrhea.  EXAM: CHEST  2 VIEW  COMPARISON:  Frontal and lateral views 08/13/2007  FINDINGS: Progressive elevation of left hemidiaphragm. There is adjacent left basilar opacity. The right lung is clear. Cardiomegaly and tortuous atherosclerotic aorta, unchanged. No large pleural effusion. No pneumothorax. No acute osseous abnormalities are seen.  IMPRESSION: Left basilar opacity. This is adjacent to elevated left hemidiaphragm. This may reflect compressive atelectasis versus pneumonia. Cardiomegaly is stable.   Electronically Signed   By: Rubye Oaks M.D.   On: 09/02/2015 00:17   Ct Head Wo Contrast  09/02/2015   CLINICAL DATA:  Weakness and altered mental status.  EXAM: CT HEAD WITHOUT CONTRAST  TECHNIQUE: Contiguous axial images were obtained from the base of the skull through the vertex without intravenous contrast.  COMPARISON:  CT 11/06/2010  FINDINGS: Generalized atrophy, most significant in the high bifrontal regions. Mild chronic small vessel ischemic change. No intracranial hemorrhage, mass effect, or midline shift. No hydrocephalus. The basilar cisterns are patent. No evidence of territorial infarct. No intracranial fluid collection. Atherosclerotic calcifications of the skullbase vasculature. Calvarium is intact. Included paranasal sinuses and mastoid air cells are well aerated.  IMPRESSION: Atrophy and chronic small vessel ischemic change. No acute intracranial abnormality.   Electronically Signed   By: Rubye Oaks M.D.   On: 09/02/2015 01:21    Assessment/Plan The patient is a 81 year old female well known to our service. She is s/p a left lower extremity  femoropopliteal bypass in the past. She has undergone a left lower extremity angiogram with stent placement for  non-healing heel ulceration in 07/2015. She was just seen in our office on Tuesday 08/29/15 in follow up. Patient admitted to ICU for severe sepsis, acute encephalopathy and possible C. Diff.  1) Patient seen in clinic on Tuesday - adequate blood flow to left lower extremity - no intervention at this time. Eschar should fall off in 3-4 weeks.  2) Recommend avoiding pressure to left heel / foot 3) Discussed with Dr. Weldon Inches, PA-C  09/02/2015 1:43 PM

## 2015-09-02 NOTE — ED Notes (Signed)
Pt resting in bed.  At times she is restless and trying to pull at IV line.  Current IV in right AC is almost out of her arm.  Removed completely and restarted a new IV in R FA.  Patient is alert and talking some, but is lethargic.  Daughter is at bedside.  Patient frequently moving to try to take off pulse ox sensor.  BP still low, but improving.  Levophed and Saline running. Will also start Flagyl in new IV site. Pending bed assignment at this time.

## 2015-09-02 NOTE — H&P (Signed)
[facility] - [place] at [facility]   PATIENT NAME: [name]    MR#:  [id number]  DATE OF BIRTH:  [birth year]  DATE OF ADMISSION:  09/01/2015  PRIMARY CARE PHYSICIAN: Lavetta Nielsen, MD   REQUESTING/REFERRING PHYSICIAN: Marge Duncans  CHIEF COMPLAINT:   Chief Complaint  Patient presents with  . Altered Mental Status    HISTORY OF PRESENT ILLNESS:  [name]  is a 80 y.o. female with a known history of diabetes mellitus type 2, CK D stage III, hypertension, peripheral arterial disease status post femoropopliteal bypass, dementia, COPD, chronic anemia, on DO NOT RESUSCITATE status brought in with the complaints of decreased mental status since yesterday. According to the patient's daughters, she was not feeling well and not eating or drinking well for the past few days and was noted to have decreased mental status yesterday hence brought to the emergency room for further evaluation. En route to the hospital she had a large amount of loose stools and also had another large bowel movement in the ED. She underwent stenting for the left lower extremity for peripheral arterial disease in August and later on she developed wound over the left heel and dry gangrene of left fifth toe and was on oral antibiotics for the past 21 days which she completed 2 days ago. No history of any fever or chills. Patient is alert awake but not able to give any history because of acute illness and the history I got is from the ED physician and the patient's family who are with the patient at this time. No history of any chest pain, shortness of breath, cough. She is being cared by her daughter at home on total care dependent because of dementia but able to communicate her needs. Evaluation in the ED revealed elevated WBC of 29.0, H&H 9.7/30.0, sodium 129, BUN/creatinine 48/3.16, troponin mild elevation of 0.05, lactic acid elevated at 4.1. Urinalysis cloudy with WBC 6-30 and 1+  bacteria. Chest x-ray significant for left basilar opacity-likely atelectasis versus pneumonia. CT head negative for acute intracranial pathology. CT of the abdomen the pelvis with prominent fluid-filled loops consistent with enteritis and cyst left adnexal lesion. Patient was also noted to have borderline blood pressure with systolic 90-100. After obtaining blood and urine cultures patient was started on broad-spectrum antibiotics-vancomycin, Zosyn and Flagyl and was also given IV fluids normal saline boluses. Hospitalist service was consulted for further management. According to the patient's daughter who is her healthcare power of attorney and who is with the patient at this time, patient has DO NOT RESUSCITATE order at home.  PAST MEDICAL HISTORY:   Past Medical History  Diagnosis Date  . Abnormal heart rhythms   . Diabetes mellitus without complication   . Coronary artery disease   . Hyperlipidemia   . Hypertension   . Chronic kidney disease   . Varicose veins   . Peripheral vascular disease   . Anemia   . Arthritis     PAST SURGICAL HISTORY:   Past Surgical History  Procedure Laterality Date  . Appendectomy    . Abdominal hysterectomy    . Cataract surgery    . Peripheral vascular catheterization Left 07/13/2015    Procedure: Lower Extremity Angiography;  Surgeon: Annice Needy, MD;  Location: [facility] INVASIVE CV LAB;  Service: Cardiovascular;  Laterality: Left;  . Peripheral vascular catheterization  07/13/2015    Procedure: Lower Extremity Intervention;  Surgeon: Annice Needy, MD;  Location: [facility] INVASIVE CV LAB;  Service: Cardiovascular;;  . Peripheral vascular catheterization Left 07/24/2015    Procedure: Lower Extremity Angiography;  Surgeon: Annice Needy, MD;  Location: [facility] INVASIVE CV LAB;  Service: Cardiovascular;  Laterality: Left;  . Peripheral vascular catheterization  07/24/2015    Procedure: Lower Extremity Intervention;  Surgeon: Annice Needy, MD;  Location: [facility] INVASIVE CV  LAB;  Service: Cardiovascular;;    SOCIAL HISTORY:   Social History  Substance Use Topics  . Smoking status: Former Smoker -- 1.00 packs/day for 42 years    Types: Cigarettes  . Smokeless tobacco: Current User    Types: Snuff  . Alcohol Use: 1.2 oz/week    2 Shots of liquor per week    FAMILY HISTORY:   Family History  Problem Relation Age of Onset  . Kidney disease Mother   . Cancer Sister   . Cancer Brother   . CVA Brother     DRUG ALLERGIES:  No Known Allergies  REVIEW OF SYSTEMS:   Review of Systems  Constitutional: Positive for malaise/fatigue. Negative for fever and chills.  HENT: Negative for ear pain, hearing loss, nosebleeds, sore throat and tinnitus.   Eyes: Negative for blurred vision, double vision, pain, discharge and redness.  Respiratory: Negative for cough, hemoptysis, sputum production, shortness of breath and wheezing.   Cardiovascular: Negative for chest pain, palpitations, orthopnea and leg swelling.  Gastrointestinal: Positive for nausea, vomiting and diarrhea. Negative for abdominal pain, constipation, blood in stool and melena.  Genitourinary: Negative for dysuria, urgency, frequency and hematuria.  Musculoskeletal: Positive for back pain. Negative for joint pain and neck pain.  Skin: Negative for itching and rash.       Chronic ulcer left heel + Black discoloration of left fifth toe +  Neurological: Negative for dizziness, tingling, sensory change, focal weakness and seizures.  Endo/Heme/Allergies: Does not bruise/bleed easily.  Psychiatric/Behavioral: Negative for depression. The patient is not nervous/anxious.     MEDICATIONS AT HOME:   Prior to Admission medications   Medication Sig Start Date End Date Taking? Authorizing Provider  albuterol (PROVENTIL HFA;VENTOLIN HFA) 108 (90 BASE) MCG/ACT inhaler Inhale into the lungs every 6 (six) hours as needed for wheezing or shortness of breath.    Historical Provider, MD  amLODipine (NORVASC) 5  MG tablet Take 5 mg by mouth daily.    Historical Provider, MD  atenolol (TENORMIN) 50 MG tablet Take 50 mg by mouth daily.    Historical Provider, MD  clopidogrel (PLAVIX) 75 MG tablet Take 75 mg by mouth daily.    Historical Provider, MD  ergocalciferol (VITAMIN D2) 50000 UNITS capsule Take 50,000 Units by mouth once a week.    Historical Provider, MD  Fluticasone-Salmeterol (ADVAIR) 500-50 MCG/DOSE AEPB Inhale 1 puff into the lungs 2 (two) times daily.    Historical Provider, MD  glipiZIDE (GLUCOTROL XL) 10 MG 24 hr tablet Take 10 mg by mouth daily with breakfast.    Historical Provider, MD  omeprazole (PRILOSEC) 20 MG capsule Take 20 mg by mouth daily.    Historical Provider, MD  oxyCODONE-acetaminophen (PERCOCET/ROXICET) 5-325 MG per tablet Take 1 tablet by mouth every 4 (four) hours as needed for severe pain (every 4-6hours for pain).    Historical Provider, MD  quinapril (ACCUPRIL) 40 MG tablet Take 40 mg by mouth at bedtime.    Historical Provider, MD      VITAL SIGNS:  Blood pressure 107/52, pulse 69, temperature 98 F (36.7 C), temperature source Oral, resp. rate 23, weight 75.161 kg (  165 lb 11.2 oz), SpO2 97 %.  PHYSICAL EXAMINATION:  Physical Exam  Constitutional: No distress.  Chronically ill-looking +  HENT:  Head: Normocephalic and atraumatic.  Right Ear: External ear normal.  Left Ear: External ear normal.  Nose: Nose normal.  Mouth/Throat: No oropharyngeal exudate.  Dry oral mucosa +  Eyes: EOM are normal. Pupils are equal, round, and reactive to light. No scleral icterus.  Neck: Normal range of motion. Neck supple. No JVD present. No thyromegaly present.  Cardiovascular: Normal rate, regular rhythm, normal heart sounds and intact distal pulses.  Exam reveals no friction rub.   No murmur heard. Respiratory: Effort normal and breath sounds normal. No respiratory distress. She has no wheezes. She has no rales. She exhibits no tenderness.  GI: Soft. Bowel sounds are  normal. She exhibits no distension and no mass. There is no tenderness. There is no rebound and no guarding.  Musculoskeletal: Normal range of motion. She exhibits no edema.  Black discoloration of left fifth toe +  Lymphadenopathy:    She has no cervical adenopathy.  Neurological: She is alert. She has normal reflexes. She displays normal reflexes. No cranial nerve deficit. She exhibits normal muscle tone.  Skin: Skin is warm. No rash noted. No erythema.  Chronic ulcer over left heel covered with black Escher +  Psychiatric: She has a normal mood and affect. Her behavior is normal. Thought content normal.   LABORATORY PANEL:   CBC  Recent Labs Lab 09/01/15 2341  WBC 29.0*  HGB 9.7*  HCT 30.0*  PLT 312   ------------------------------------------------------------------------------------------------------------------  Chemistries   Recent Labs Lab 09/01/15 2341  NA 129*  K 4.8  CL 91*  CO2 23  GLUCOSE 260*  BUN 48*  CREATININE 3.16*  CALCIUM 8.3*  AST 28  ALT 16  ALKPHOS 78  BILITOT 0.8   ------------------------------------------------------------------------------------------------------------------  Cardiac Enzymes  Recent Labs Lab 09/01/15 2341  TROPONINI 0.05*   ------------------------------------------------------------------------------------------------------------------  RADIOLOGY:  Ct Abdomen Pelvis Wo Contrast  09/02/2015   CLINICAL DATA:  Weakness, diarrhea, firm abdomen.  EXAM: CT ABDOMEN AND PELVIS WITHOUT CONTRAST  TECHNIQUE: Multidetector CT imaging of the abdomen and pelvis was performed following the standard protocol without IV contrast.  COMPARISON:  Report from CT abdomen/pelvis 03/07/2008, images not available.  FINDINGS: The heart is enlarged. There is elevation of the left hemidiaphragm. Left basilar pleural thickening with small left pleural effusion. There is adjacent left basilar opacity, likely atelectasis.  Patient motion artifact  and streak artifact from overlying arms down positioning limits assessment.  Stomach is mildly distended. Fluid-filled prominent bowel loops in the lower abdomen and pelvis without transition point. The colon is decompressed. Minimal diverticulosis of the distal colon without diverticulitis. No pneumatosis, perforation or free air.  Calcifications within both renal hila, likely combination nonobstructing stones and vascular calcifications. No hydronephrosis. Evaluation of the remaining solid and hollow viscera is limited given lack of contrast. The unenhanced liver and gallbladder are unremarkable. Pancreas is atrophic. A 1.7 cm lobular soft tissue focus in the region of the pancreatic tail, likely a splenule and described previously. Scattered splenic granuloma. Mild thickening of both adrenal glands without discrete nodule.  There is dense atherosclerosis of the abdominal aorta and its branches. No aneurysm.  Urinary bladder is decompressed. Uterus is not seen, presumably surgically absent. Within the left adnexa is a a fluid density structure with internal septation measuring 5.3 x 3.4 cm. This is difficult to delineate from the adjacent fluid-filled bowel loops. By report  this is unchanged from prior exam.  Diffuse degenerative change throughout the spine. Minimal compression deformity superior endplate of L2, appears chronic.  IMPRESSION: 1. Prominent fluid-filled small bowel loops without transition point, in a pattern consistent with enteritis. An early small bowel obstruction could have a similar appearance. 2. Dense atherosclerosis of the abdominal aorta and its branches without aneurysm. 3. Small left pleural effusion with adjacent left lower lobe opacity, likely atelectasis. 4. Cystic left adnexal lesion measures 5.3 x 3.4 cm, difficult to delineate from adjacent fluid-filled bowel loops, however this is unchanged compared to report from exam 7 years prior. Imaging stability suggests a benign etiology,  however images not available for direct comparison. After resolution of acute illness, pelvic ultrasound could be considered for characterization.   Electronically Signed   By: Rubye Oaks M.D.   On: 09/02/2015 01:34   Dg Chest 2 View  09/02/2015   CLINICAL DATA:  Weakness.  Altered mental status.  Diarrhea.  EXAM: CHEST  2 VIEW  COMPARISON:  Frontal and lateral views 08/13/2007  FINDINGS: Progressive elevation of left hemidiaphragm. There is adjacent left basilar opacity. The right lung is clear. Cardiomegaly and tortuous atherosclerotic aorta, unchanged. No large pleural effusion. No pneumothorax. No acute osseous abnormalities are seen.  IMPRESSION: Left basilar opacity. This is adjacent to elevated left hemidiaphragm. This may reflect compressive atelectasis versus pneumonia. Cardiomegaly is stable.   Electronically Signed   By: Rubye Oaks M.D.   On: 09/02/2015 00:17   Ct Head Wo Contrast  09/02/2015   CLINICAL DATA:  Weakness and altered mental status.  EXAM: CT HEAD WITHOUT CONTRAST  TECHNIQUE: Contiguous axial images were obtained from the base of the skull through the vertex without intravenous contrast.  COMPARISON:  CT 11/06/2010  FINDINGS: Generalized atrophy, most significant in the high bifrontal regions. Mild chronic small vessel ischemic change. No intracranial hemorrhage, mass effect, or midline shift. No hydrocephalus. The basilar cisterns are patent. No evidence of territorial infarct. No intracranial fluid collection. Atherosclerotic calcifications of the skullbase vasculature. Calvarium is intact. Included paranasal sinuses and mastoid air cells are well aerated.  IMPRESSION: Atrophy and chronic small vessel ischemic change. No acute intracranial abnormality.   Electronically Signed   By: Rubye Oaks M.D.   On: 09/02/2015 01:21    EKG:   Orders placed or performed during the hospital encounter of 09/01/15  . ED EKG  . ED EKG  Normal sinus rhythm with ventricular rate  of 73 bpm, right bundle branch block.  IMPRESSION AND PLAN:   1. Severe sepsis secondary to combination of UTI, probable left lower lobe pneumonia, enteritis and chronic ulcer left heel. 2. Acute encephalopathy secondary to combination of sepsis and dehydration. 3. Acute kidney injury on CK D stage III. 4. Enteritis. History of recent antibiotic use for past 3 weeks-suspect C. difficile colitis. 5. Hyponatremia secondary to dehydration due to poor oral intake. Plan: Admit to stepdown unit, nothing by mouth, IV fluids-normal saline bolus, consider pressor agents if blood pressure remains low,  continue IV antibiotics-vancomycin, Zosyn and Flagyl, follow-up labs-CBC, BMP, blood and urine cultures and serum lactate.  Consider nephrology consultation if BUN/creatinine does not improve.  GI consultation requested for advice regarding enteritis and further management. 6. Mildly elevated troponin. No history of coronary artery disease, no cardiac symptoms. Likely demand ischemia. Plan: Telemetry monitoring, cycle cardiac enzymes. Consider further cardiac workup accordingly.  7. Hypertension, patient currently hypotensive. Hold antihypertensive medications for now. IV hydration, follow-up BP measurements. 8. Diabetes  mellitus type 2. Plan: Sliding scale insulin, follow blood sugars. 9. Peripheral artery disease status post left lower extremity femoropopliteal bypass. Chronic ulcer left heel and dry gangrene left fifth toe. Plan: Vascular surgery consultation requested for further advice. 10. Chronic anemia, H&H low but stable. Monitor H&H closely. 11. Dementia. Continue supportive care.    All the records are reviewed and case discussed with ED provider. Management plans discussed with the patient, family and they are in agreement.  CODE STATUS: DO NOT RESUSCITATE(confirmed with patient's daughter who is her healthcare power of attorney)  TOTAL TIME TAKING CARE OF THIS PATIENT: 50 minutes.     Crissie Figures M.D on 09/02/2015 at 3:04 AM  Between 7am to 6pm - Pager - [phone]  After 6pm go to [email or web address] - password [email or web address] [facility]  [facility]  Office  [phone]  CC: Primary care physician; Lavetta Nielsen, MD

## 2015-09-03 ENCOUNTER — Inpatient Hospital Stay: Payer: Medicare (Managed Care)

## 2015-09-03 LAB — URINE CULTURE: Culture: NO GROWTH

## 2015-09-03 LAB — BASIC METABOLIC PANEL
Anion gap: 8 (ref 5–15)
BUN: 47 mg/dL — AB (ref 6–20)
CHLORIDE: 98 mmol/L — AB (ref 101–111)
CO2: 24 mmol/L (ref 22–32)
CREATININE: 2.78 mg/dL — AB (ref 0.44–1.00)
Calcium: 7.8 mg/dL — ABNORMAL LOW (ref 8.9–10.3)
GFR calc non Af Amer: 14 mL/min — ABNORMAL LOW (ref >60)
GFR, EST AFRICAN AMERICAN: 16 mL/min — AB (ref >60)
Glucose, Bld: 126 mg/dL — ABNORMAL HIGH (ref 65–99)
POTASSIUM: 4.4 mmol/L (ref 3.5–5.1)
SODIUM: 130 mmol/L — AB (ref 135–145)

## 2015-09-03 LAB — CBC
HEMATOCRIT: 26.5 % — AB (ref 35.0–47.0)
Hemoglobin: 8.7 g/dL — ABNORMAL LOW (ref 12.0–16.0)
MCH: 30.9 pg (ref 26.0–34.0)
MCHC: 33 g/dL (ref 32.0–36.0)
MCV: 93.9 fL (ref 80.0–100.0)
Platelets: 304 10*3/uL (ref 150–440)
RBC: 2.83 MIL/uL — AB (ref 3.80–5.20)
RDW: 13.3 % (ref 11.5–14.5)
WBC: 29.1 10*3/uL — AB (ref 3.6–11.0)

## 2015-09-03 LAB — GLUCOSE, CAPILLARY
GLUCOSE-CAPILLARY: 107 mg/dL — AB (ref 65–99)
GLUCOSE-CAPILLARY: 193 mg/dL — AB (ref 65–99)
GLUCOSE-CAPILLARY: 99 mg/dL (ref 65–99)
Glucose-Capillary: 100 mg/dL — ABNORMAL HIGH (ref 65–99)
Glucose-Capillary: 113 mg/dL — ABNORMAL HIGH (ref 65–99)
Glucose-Capillary: 125 mg/dL — ABNORMAL HIGH (ref 65–99)
Glucose-Capillary: 188 mg/dL — ABNORMAL HIGH (ref 65–99)

## 2015-09-03 LAB — TROPONIN I: Troponin I: 0.06 ng/mL — ABNORMAL HIGH (ref <0.031)

## 2015-09-03 MED ORDER — ALBUTEROL SULFATE (2.5 MG/3ML) 0.083% IN NEBU
2.5000 mg | INHALATION_SOLUTION | Freq: Four times a day (QID) | RESPIRATORY_TRACT | Status: DC | PRN
  Administered 2015-09-06 – 2015-09-08 (×5): 2.5 mg via RESPIRATORY_TRACT
  Filled 2015-09-03 (×5): qty 3

## 2015-09-03 MED ORDER — NOREPINEPHRINE 4 MG/250ML-% IV SOLN
INTRAVENOUS | Status: AC
  Administered 2015-09-03: 4 ug/min via INTRAVENOUS
  Filled 2015-09-03: qty 250

## 2015-09-03 MED ORDER — NOREPINEPHRINE 4 MG/250ML-% IV SOLN
0.0000 ug/min | INTRAVENOUS | Status: DC
  Administered 2015-09-03: 4 ug/min via INTRAVENOUS
  Administered 2015-09-03 – 2015-09-04 (×2): 9 ug/min via INTRAVENOUS
  Administered 2015-09-04: 7.5 ug/min via INTRAVENOUS
  Administered 2015-09-04: 10 ug/min via INTRAVENOUS
  Filled 2015-09-03 (×4): qty 250

## 2015-09-03 MED ORDER — SODIUM CHLORIDE 0.9 % IV SOLN
INTRAVENOUS | Status: DC
Start: 2015-09-03 — End: 2015-09-07
  Administered 2015-09-04 – 2015-09-06 (×2): via INTRAVENOUS

## 2015-09-03 MED ORDER — NOREPINEPHRINE BITARTRATE 1 MG/ML IV SOLN: 0.0000 ug/min | INTRAVENOUS | Status: DC

## 2015-09-03 NOTE — Consult Note (Signed)
Date: 09/03/2015                  Patient Name:  [name]  MRN: [id number]  DOB: 1921/07/28  Age / Sex: 81 y.o., female         PCP: Lavetta Nielsen, MD                 Service Requesting Consult:  internal medicine                  Reason for Consult:  acute renal failure             History of Present Illness: Patient is a 81 y.o. female with medical problems of diabetes type 2, chronic kidney disease, hypertension, peripheral vascular disease status post femoropopliteal bypass, dementia, COPD, anemia, who was admitted to [facility] on 09/01/2015 for evaluation of evaluation of decreased mental status, poor appetite and encephalopathy.   She has undergone a recent left lower extremity angiogram for nonhealing left toe ulcer. Her baseline creatinine is 1.01 Admission creatinine is 3.16 Today's creatinine is 2.78 Patient is lethargic. Did not wake up to answer any questions.   Medications: Outpatient medications: Prescriptions prior to admission  Medication Sig Dispense Refill Last Dose  . albuterol (PROVENTIL HFA;VENTOLIN HFA) 108 (90 BASE) MCG/ACT inhaler Inhale into the lungs every 6 (six) hours as needed for wheezing or shortness of breath.   Past Week at Unknown time  . albuterol (PROVENTIL) (2.5 MG/3ML) 0.083% nebulizer solution Take 2.5 mg by nebulization every 6 (six) hours as needed for wheezing or shortness of breath.   Past Week at Unknown time  . amLODipine (NORVASC) 5 MG tablet Take 5 mg by mouth daily.   09/01/2015 at Unknown time  . atenolol (TENORMIN) 50 MG tablet Take 50 mg by mouth daily.   09/01/2015 at 1100  . clopidogrel (PLAVIX) 75 MG tablet Take 75 mg by mouth daily.   09/01/2015 at Unknown time  . ergocalciferol (VITAMIN D2) 50000 UNITS capsule Take 50,000 Units by mouth every 30 (thirty) days.    Past Month at Unknown time  . Fluticasone-Salmeterol (ADVAIR) 500-50 MCG/DOSE AEPB Inhale 1 puff into the lungs 2 (two) times daily.   09/01/2015 at Unknown  time  . glipiZIDE (GLUCOTROL XL) 10 MG 24 hr tablet Take 5 mg by mouth 2 (two) times daily.    09/01/2015 at Unknown time  . loratadine (CLARITIN) 10 MG tablet Take 10 mg by mouth daily as needed for allergies.   Past Week at Unknown time  . omeprazole (PRILOSEC) 20 MG capsule Take 20 mg by mouth daily.   09/01/2015 at Unknown time  . oxyCODONE-acetaminophen (PERCOCET/ROXICET) 5-325 MG per tablet Take 1 tablet by mouth every 4 (four) hours as needed for severe pain (every 4-6hours for pain).   09/01/2015 at Unknown time  . quinapril (ACCUPRIL) 40 MG tablet Take 40 mg by mouth at bedtime.   09/01/2015 at Unknown time    Current medications: Current Facility-Administered Medications  Medication Dose Route Frequency Provider Last Rate Last Dose  . acetaminophen (TYLENOL) tablet 650 mg  650 mg Oral Q6H PRN Crissie Figures, MD       Or  . acetaminophen (TYLENOL) suppository 650 mg  650 mg Rectal Q6H PRN Crissie Figures, MD      . albuterol (PROVENTIL) (2.5 MG/3ML) 0.083% nebulizer solution 2.5 mg  2.5 mg Nebulization Q6H PRN Crissie Figures, MD      . aspirin EC  tablet 81 mg  81 mg Oral Daily Crissie Figures, MD   81 mg at 09/02/15 0932  . clopidogrel (PLAVIX) tablet 75 mg  75 mg Oral Daily Crissie Figures, MD   75 mg at 09/02/15 0933  . heparin injection 5,000 Units  5,000 Units Subcutaneous 3 times per day Crissie Figures, MD   5,000 Units at 09/03/15 [phone]  . insulin aspart (novoLOG) injection 0-9 Units  0-9 Units Subcutaneous 6 times per day Crissie Figures, MD   1 Units at 09/03/15 0517  . metroNIDAZOLE (FLAGYL) IVPB 500 mg  500 mg Intravenous Q8H Crissie Figures, MD 100 mL/hr at 09/03/15 0632 500 mg at 09/03/15 9604  . mometasone-formoterol (DULERA) 200-5 MCG/ACT inhaler 2 puff  2 puff Inhalation BID Crissie Figures, MD   2 puff at 09/02/15 2241  . norepinephrine (LEVOPHED) 16 mg in dextrose 5 % 250 mL (0.064 mg/mL) infusion  0-40 mcg/min Intravenous Titrated Alford Highland, MD 5.6 mL/hr  at 09/03/15 0841 6 mcg/min at 09/03/15 0841  . ondansetron (ZOFRAN) tablet 4 mg  4 mg Oral Q6H PRN Crissie Figures, MD       Or  . ondansetron [facility]) injection 4 mg  4 mg Intravenous Q6H PRN Crissie Figures, MD      . oxyCODONE-acetaminophen (PERCOCET/ROXICET) 5-325 MG per tablet 1 tablet  1 tablet Oral Q4H PRN Crissie Figures, MD      . pantoprazole (PROTONIX) EC tablet 40 mg  40 mg Oral Daily Crissie Figures, MD   40 mg at 09/02/15 0933  . piperacillin-tazobactam (ZOSYN) IVPB 3.375 g  3.375 g Intravenous Q12H Crissie Figures, MD   3.375 g at 09/03/15 0059  . sodium chloride 0.9 % injection 3 mL  3 mL Intravenous Q12H Crissie Figures, MD   3 mL at 09/02/15 2214  . vancomycin (VANCOCIN) IVPB 1000 mg/200 mL premix  1,000 mg Intravenous Q72H Crissie Figures, MD      . Melene Muller ON 09/04/2015] Vitamin D (Ergocalciferol) (DRISDOL) capsule 50,000 Units  50,000 Units Oral Weekly Crissie Figures, MD          Allergies: No Known Allergies    Past Medical History: Past Medical History  Diagnosis Date  . Abnormal heart rhythms   . Diabetes mellitus without complication   . Coronary artery disease   . Hyperlipidemia   . Hypertension   . Chronic kidney disease   . Varicose veins   . Peripheral vascular disease   . Anemia   . Arthritis      Past Surgical History: Past Surgical History  Procedure Laterality Date  . Appendectomy    . Abdominal hysterectomy    . Cataract surgery    . Peripheral vascular catheterization Left 07/13/2015    Procedure: Lower Extremity Angiography;  Surgeon: Annice Needy, MD;  Location: [facility] INVASIVE CV LAB;  Service: Cardiovascular;  Laterality: Left;  . Peripheral vascular catheterization  07/13/2015    Procedure: Lower Extremity Intervention;  Surgeon: Annice Needy, MD;  Location: [facility] INVASIVE CV LAB;  Service: Cardiovascular;;  . Peripheral vascular catheterization Left 07/24/2015    Procedure: Lower Extremity Angiography;  Surgeon: Annice Needy, MD;   Location: [facility] INVASIVE CV LAB;  Service: Cardiovascular;  Laterality: Left;  . Peripheral vascular catheterization  07/24/2015    Procedure: Lower Extremity Intervention;  Surgeon: Annice Needy, MD;  Location: [facility] INVASIVE CV LAB;  Service: Cardiovascular;;     Family History:  Family History  Problem Relation Age of Onset  . Kidney disease Mother   . Cancer Sister   . Cancer Brother   . CVA Brother      Social History: Social History   Social History  . Marital Status: Married    Spouse Name: N/A  . Number of Children: N/A  . Years of Education: N/A   Occupational History  . Not on file.   Social History Main Topics  . Smoking status: Former Smoker -- 1.00 packs/day for 42 years    Types: Cigarettes  . Smokeless tobacco: Current User    Types: Snuff  . Alcohol Use: 1.2 oz/week    2 Shots of liquor per week  . Drug Use: Not on file  . Sexual Activity: Not on file   Other Topics Concern  . Not on file   Social History Narrative     Review of Systems: Unavailable. Patient has baseline dementia.   Vital Signs: Blood pressure 114/58, pulse 72, temperature 97.9 F (36.6 C), temperature source Oral, resp. rate 16, weight 72.8 kg (160 lb 7.9 oz), SpO2 98 %.   Intake/Output Summary (Last 24 hours) at 09/03/15 0925 Last data filed at 09/03/15 1610  Gross per 24 hour  Intake 1351.4 ml  Output    250 ml  Net 1101.4 ml    Weight trends: Filed Weights   09/02/15 0245 09/03/15 0500  Weight: 75.161 kg (165 lb 11.2 oz) 72.8 kg (160 lb 7.9 oz)    Physical Exam: General:  frail, elderly woman, lying in the bed, no distress   HEENT  eyes close, moist mucous membranes   Neck:  supple, no masses   Lungs:  normal respiratory effort,   Heart::  regular, tachycardic   Abdomen:  soft, nontender, nondistended   Extremities:  left fifth toe dry gangrene, Eschar on heel   Neurologic:  somnolent, did not answer any questions   Skin:  no acute rashes              Lab  results: Basic Metabolic Panel:  Recent Labs Lab 09/01/15 2341 09/02/15 0612 09/03/15 0428  NA 129* 129* 130*  K 4.8 5.1 4.4  CL 91* 97* 98*  CO2 23 21* 24  GLUCOSE 260* 208* 126*  BUN 48* 47* 47*  CREATININE 3.16* 2.93* 2.78*  CALCIUM 8.3* 7.7* 7.8*    Liver Function Tests:  Recent Labs Lab 09/01/15 2341  AST 28  ALT 16  ALKPHOS 78  BILITOT 0.8  PROT 6.4*  ALBUMIN 3.6    Recent Labs Lab 09/01/15 2341  LIPASE 41   No results for input(s): AMMONIA in the last 168 hours.  CBC:  Recent Labs Lab 09/01/15 2341 09/02/15 0612 09/03/15 0428  WBC 29.0* 28.7* 29.1*  NEUTROABS 22.0*  --   --   HGB 9.7* 9.0* 8.7*  HCT 30.0* 27.7* 26.5*  MCV 93.5 92.9 93.9  PLT 312 286 304    Cardiac Enzymes:  Recent Labs Lab 09/03/15 0114  TROPONINI 0.06*    BNP: Invalid input(s): POCBNP  CBG:  Recent Labs Lab 09/02/15 1747 09/02/15 2007 09/03/15 0053 09/03/15 0420 09/03/15 0756  GLUCAP 194* 136* 100* 125* 107*    Microbiology: Recent Results (from the past 720 hour(s))  Urine culture     Status: None (Preliminary result)   Collection Time: 09/01/15 11:41 PM  Result Value Ref Range Status   Specimen Description URINE, RANDOM  Final   Special Requests NONE  Final  Culture NO GROWTH < 12 HOURS  Final   Report Status PENDING  Incomplete  Stool culture     Status: None (Preliminary result)   Collection Time: 09/02/15  9:10 AM  Result Value Ref Range Status   Specimen Description STOOL  Final   Special Requests NONE  Final   Culture   Final    NO CAMPYLOBACTER DETECTED NO SALMONELLA OR SHIGELLA ISOLATED    Report Status PENDING  Incomplete  MRSA PCR Screening     Status: None   Collection Time: 09/02/15  9:10 AM  Result Value Ref Range Status   MRSA by PCR NEGATIVE NEGATIVE Final    Comment:        The GeneXpert MRSA Assay (FDA approved for NASAL specimens only), is one component of a comprehensive MRSA colonization surveillance program. It is  not intended to diagnose MRSA infection nor to guide or monitor treatment for MRSA infections.   C difficile quick scan w PCR reflex     Status: None   Collection Time: 09/02/15  9:10 AM  Result Value Ref Range Status   C Diff antigen NEGATIVE NEGATIVE Final   C Diff toxin NEGATIVE NEGATIVE Final   C Diff interpretation Negative for C. difficile  Final     Coagulation Studies: No results for input(s): LABPROT, INR in the last 72 hours.  Urinalysis:  Recent Labs  09/01/15 2341  COLORURINE AMBER*  LABSPEC 1.024  PHURINE 5.0  GLUCOSEU NEGATIVE  HGBUR NEGATIVE  BILIRUBINUR 1+*  KETONESUR TRACE*  PROTEINUR 100*  NITRITE NEGATIVE  LEUKOCYTESUR NEGATIVE      Imaging: Ct Abdomen Pelvis Wo Contrast  09/02/2015   CLINICAL DATA:  Weakness, diarrhea, firm abdomen.  EXAM: CT ABDOMEN AND PELVIS WITHOUT CONTRAST  TECHNIQUE: Multidetector CT imaging of the abdomen and pelvis was performed following the standard protocol without IV contrast.  COMPARISON:  Report from CT abdomen/pelvis 03/07/2008, images not available.  FINDINGS: The heart is enlarged. There is elevation of the left hemidiaphragm. Left basilar pleural thickening with small left pleural effusion. There is adjacent left basilar opacity, likely atelectasis.  Patient motion artifact and streak artifact from overlying arms down positioning limits assessment.  Stomach is mildly distended. Fluid-filled prominent bowel loops in the lower abdomen and pelvis without transition point. The colon is decompressed. Minimal diverticulosis of the distal colon without diverticulitis. No pneumatosis, perforation or free air.  Calcifications within both renal hila, likely combination nonobstructing stones and vascular calcifications. No hydronephrosis. Evaluation of the remaining solid and hollow viscera is limited given lack of contrast. The unenhanced liver and gallbladder are unremarkable. Pancreas is atrophic. A 1.7 cm lobular soft tissue focus  in the region of the pancreatic tail, likely a splenule and described previously. Scattered splenic granuloma. Mild thickening of both adrenal glands without discrete nodule.  There is dense atherosclerosis of the abdominal aorta and its branches. No aneurysm.  Urinary bladder is decompressed. Uterus is not seen, presumably surgically absent. Within the left adnexa is a a fluid density structure with internal septation measuring 5.3 x 3.4 cm. This is difficult to delineate from the adjacent fluid-filled bowel loops. By report this is unchanged from prior exam.  Diffuse degenerative change throughout the spine. Minimal compression deformity superior endplate of L2, appears chronic.  IMPRESSION: 1. Prominent fluid-filled small bowel loops without transition point, in a pattern consistent with enteritis. An early small bowel obstruction could have a similar appearance. 2. Dense atherosclerosis of the abdominal aorta and its branches without  aneurysm. 3. Small left pleural effusion with adjacent left lower lobe opacity, likely atelectasis. 4. Cystic left adnexal lesion measures 5.3 x 3.4 cm, difficult to delineate from adjacent fluid-filled bowel loops, however this is unchanged compared to report from exam 7 years prior. Imaging stability suggests a benign etiology, however images not available for direct comparison. After resolution of acute illness, pelvic ultrasound could be considered for characterization.   Electronically Signed   By: Rubye Oaks M.D.   On: 09/02/2015 01:34   Dg Chest 2 View  09/02/2015   CLINICAL DATA:  Weakness.  Altered mental status.  Diarrhea.  EXAM: CHEST  2 VIEW  COMPARISON:  Frontal and lateral views 08/13/2007  FINDINGS: Progressive elevation of left hemidiaphragm. There is adjacent left basilar opacity. The right lung is clear. Cardiomegaly and tortuous atherosclerotic aorta, unchanged. No large pleural effusion. No pneumothorax. No acute osseous abnormalities are seen.   IMPRESSION: Left basilar opacity. This is adjacent to elevated left hemidiaphragm. This may reflect compressive atelectasis versus pneumonia. Cardiomegaly is stable.   Electronically Signed   By: Rubye Oaks M.D.   On: 09/02/2015 00:17   Ct Head Wo Contrast  09/02/2015   CLINICAL DATA:  Weakness and altered mental status.  EXAM: CT HEAD WITHOUT CONTRAST  TECHNIQUE: Contiguous axial images were obtained from the base of the skull through the vertex without intravenous contrast.  COMPARISON:  CT 11/06/2010  FINDINGS: Generalized atrophy, most significant in the high bifrontal regions. Mild chronic small vessel ischemic change. No intracranial hemorrhage, mass effect, or midline shift. No hydrocephalus. The basilar cisterns are patent. No evidence of territorial infarct. No intracranial fluid collection. Atherosclerotic calcifications of the skullbase vasculature. Calvarium is intact. Included paranasal sinuses and mastoid air cells are well aerated.  IMPRESSION: Atrophy and chronic small vessel ischemic change. No acute intracranial abnormality.   Electronically Signed   By: Rubye Oaks M.D.   On: 09/02/2015 01:21   Dg Chest Port 1 View  09/03/2015   CLINICAL DATA:  Encounter for central line placement.  EXAM: PORTABLE CHEST - 1 VIEW  COMPARISON:  Frontal and lateral views obtained yesterday.  FINDINGS: Tip of the right central line in the mid proximal SVC. No pneumothorax. Progressive left basilar opacity and volume loss in the left hemithorax. The heart is enlarged. No pulmonary edema.  IMPRESSION: 1. Tip of the right central line in the SVC, no pneumothorax. 2. Progressive volume loss in the left lower lobe with increased retrocardiac opacity, likely atelectasis. Stable cardiomegaly.   Electronically Signed   By: Rubye Oaks M.D.   On: 09/03/2015 01:55      Assessment & Plan: Pt is a 81 y.o. yo female with a PMHX of diabetes type 2, chronic kidney disease, hypertension, peripheral  vascular disease status post femoropopliteal bypass, dementia, COPD, anemia, was admitted on 09/01/2015 with altered mental status.   1. ARF, likely ATN Home medication list includes quinapril, amlodipine which could certainly cause ATN in the setting of hypotension and poor by mouth intake. Given patient's history of dementia, would recommend not to continue quinapril as outpatient. Serum creatinine is improving slowly Continue supportive care  2. Chronic kidney disease stage III. Baseline creatinine 1.01/GFR 54 Chronic kidney disease is likely secondary to atherosclerosis as vascular calcifications were noted in the CT scan

## 2015-09-03 NOTE — Progress Notes (Signed)
   09/03/15 1815  Vitals  BP 116/68 mmHg  MAP (mmHg) 83  Pulse Rate 79  ECG Heart Rate 79  Resp 19  Oxygen Therapy  SpO2 98 %  Elink MD advised to cont with BP med support to maintain positive BP, family stated pt had not been eating well at home for 1-2 weeks prior to [employer]

## 2015-09-03 NOTE — Consult Note (Signed)
Pt asleep but awoke while examing her.  She is able to follow simple commands. She has a few bowel sounds and no rebound or peritoneal signs. Nurses report no diarrhea, her stool studies were neg.  C. Diff neg, cultures neg. Her fluid filled small bowel on CT is of uncertain origin.  Possible early obstruction but would expect to see more evidence of that by now.  Vascular decrease to small bowel possible but that would usually cause more pain and peritoneal signs. Will follow with you.

## 2015-09-03 NOTE — Progress Notes (Signed)
Patient ID: [name], female   DOB: [birth year], 81 y.o.   MRN: [id number] [facility] PROGRESS NOTE  PCP: Lavetta Nielsen, MD  HPI/Subjective: Patient lethargic but awakens from sleep. No complaints. Does answer some yes or no questions at does not elaborate.  Objective: Filed Vitals:   09/03/15 1045  BP: 95/53  Pulse: 64  Temp:   Resp: 15    Filed Weights   09/02/15 0245 09/03/15 0500  Weight: 75.161 kg (165 lb 11.2 oz) 72.8 kg (160 lb 7.9 oz)    ROS: Review of Systems  Constitutional: Negative for fever and chills.  Eyes: Negative for blurred vision.  Respiratory: Negative for cough and shortness of breath.   Cardiovascular: Negative for chest pain.  Gastrointestinal: Negative for nausea, vomiting, abdominal pain, diarrhea and constipation.  Genitourinary: Negative for dysuria.  Musculoskeletal: Negative for joint pain.  Neurological: Negative for dizziness and headaches.   Exam: Physical Exam  Constitutional: She appears lethargic.  HENT:  Nose: No mucosal edema.  Mouth/Throat: No oropharyngeal exudate or posterior oropharyngeal edema.  Eyes: Conjunctivae, EOM and lids are normal. Pupils are equal, round, and reactive to light.  Neck: No JVD present. Carotid bruit is not present. No edema present. No thyroid mass and no thyromegaly present.  Cardiovascular: S1 normal and S2 normal.  Exam reveals no gallop.   No murmur heard. Pulses:      Dorsalis pedis pulses are 2+ on the right side, and 2+ on the left side.  Respiratory: No respiratory distress. She has no wheezes. She has no rhonchi. She has rales in the right lower field and the left lower field.  GI: Soft. Bowel sounds are normal. There is no tenderness.  Musculoskeletal:       Right ankle: She exhibits swelling.       Left ankle: She exhibits swelling.  Lymphadenopathy:    She has no cervical adenopathy.  Neurological: She appears lethargic.  Moves her arms on her own  Skin:  Skin is warm. No rash noted. Nails show no clubbing.  Blackened fifth toe left foot. Blackened eschar left Achilles area.  Psychiatric:  Lethargic    Data Reviewed: Basic Metabolic Panel:  Recent Labs Lab 09/01/15 2341 09/02/15 0612 09/03/15 0428  NA 129* 129* 130*  K 4.8 5.1 4.4  CL 91* 97* 98*  CO2 23 21* 24  GLUCOSE 260* 208* 126*  BUN 48* 47* 47*  CREATININE 3.16* 2.93* 2.78*  CALCIUM 8.3* 7.7* 7.8*   Liver Function Tests:  Recent Labs Lab 09/01/15 2341  AST 28  ALT 16  ALKPHOS 78  BILITOT 0.8  PROT 6.4*  ALBUMIN 3.6    Recent Labs Lab 09/01/15 2341  LIPASE 41   CBC:  Recent Labs Lab 09/01/15 2341 09/02/15 0612 09/03/15 0428  WBC 29.0* 28.7* 29.1*  NEUTROABS 22.0*  --   --   HGB 9.7* 9.0* 8.7*  HCT 30.0* 27.7* 26.5*  MCV 93.5 92.9 93.9  PLT 312 286 304   Cardiac Enzymes:  Recent Labs Lab 09/01/15 2341 09/02/15 0612 09/02/15 1347 09/02/15 1830 09/03/15 0114  TROPONINI 0.05* 0.05* 0.06* 0.06* 0.06*    CBG:  Recent Labs Lab 09/02/15 2007 09/03/15 0053 09/03/15 0420 09/03/15 0756 09/03/15 1202  GLUCAP 136* 100* 125* 107* 99    Recent Results (from the past 240 hour(s))  Urine culture     Status: None   Collection Time: 09/01/15 11:41 PM  Result Value Ref Range Status   Specimen Description URINE,  RANDOM  Final   Special Requests NONE  Final   Culture NO GROWTH 2 DAYS  Final   Report Status 09/03/2015 FINAL  Final  Culture, blood (routine x 2)     Status: None (Preliminary result)   Collection Time: 09/02/15  1:28 AM  Result Value Ref Range Status   Specimen Description BLOOD LEFT ASSIST CONTROL  Final   Special Requests BOTTLES DRAWN AEROBIC AND ANAEROBIC 4CC  Final   Culture NO GROWTH 1 DAY  Final   Report Status PENDING  Incomplete  Culture, blood (routine x 2)     Status: None (Preliminary result)   Collection Time: 09/02/15  1:28 AM  Result Value Ref Range Status   Specimen Description BLOOD RIGHT ASSIST CONTROL   Final   Special Requests BOTTLES DRAWN AEROBIC AND ANAEROBIC 4CC  Final   Culture NO GROWTH 1 DAY  Final   Report Status PENDING  Incomplete  Stool culture     Status: None (Preliminary result)   Collection Time: 09/02/15  9:10 AM  Result Value Ref Range Status   Specimen Description STOOL  Final   Special Requests NONE  Final   Culture   Final    NO CAMPYLOBACTER DETECTED NO SALMONELLA OR SHIGELLA ISOLATED No Pathogenic E. coli detected    Report Status PENDING  Incomplete  MRSA PCR Screening     Status: None   Collection Time: 09/02/15  9:10 AM  Result Value Ref Range Status   MRSA by PCR NEGATIVE NEGATIVE Final    Comment:        The GeneXpert MRSA Assay (FDA approved for NASAL specimens only), is one component of a comprehensive MRSA colonization surveillance program. It is not intended to diagnose MRSA infection nor to guide or monitor treatment for MRSA infections.   C difficile quick scan w PCR reflex     Status: None   Collection Time: 09/02/15  9:10 AM  Result Value Ref Range Status   C Diff antigen NEGATIVE NEGATIVE Final   C Diff toxin NEGATIVE NEGATIVE Final   C Diff interpretation Negative for C. difficile  Final     Studies: Ct Abdomen Pelvis Wo Contrast  09/02/2015   CLINICAL DATA:  Weakness, diarrhea, firm abdomen.  EXAM: CT ABDOMEN AND PELVIS WITHOUT CONTRAST  TECHNIQUE: Multidetector CT imaging of the abdomen and pelvis was performed following the standard protocol without IV contrast.  COMPARISON:  Report from CT abdomen/pelvis 03/07/2008, images not available.  FINDINGS: The heart is enlarged. There is elevation of the left hemidiaphragm. Left basilar pleural thickening with small left pleural effusion. There is adjacent left basilar opacity, likely atelectasis.  Patient motion artifact and streak artifact from overlying arms down positioning limits assessment.  Stomach is mildly distended. Fluid-filled prominent bowel loops in the lower abdomen and pelvis  without transition point. The colon is decompressed. Minimal diverticulosis of the distal colon without diverticulitis. No pneumatosis, perforation or free air.  Calcifications within both renal hila, likely combination nonobstructing stones and vascular calcifications. No hydronephrosis. Evaluation of the remaining solid and hollow viscera is limited given lack of contrast. The unenhanced liver and gallbladder are unremarkable. Pancreas is atrophic. A 1.7 cm lobular soft tissue focus in the region of the pancreatic tail, likely a splenule and described previously. Scattered splenic granuloma. Mild thickening of both adrenal glands without discrete nodule.  There is dense atherosclerosis of the abdominal aorta and its branches. No aneurysm.  Urinary bladder is decompressed. Uterus is not seen, presumably  surgically absent. Within the left adnexa is a a fluid density structure with internal septation measuring 5.3 x 3.4 cm. This is difficult to delineate from the adjacent fluid-filled bowel loops. By report this is unchanged from prior exam.  Diffuse degenerative change throughout the spine. Minimal compression deformity superior endplate of L2, appears chronic.  IMPRESSION: 1. Prominent fluid-filled small bowel loops without transition point, in a pattern consistent with enteritis. An early small bowel obstruction could have a similar appearance. 2. Dense atherosclerosis of the abdominal aorta and its branches without aneurysm. 3. Small left pleural effusion with adjacent left lower lobe opacity, likely atelectasis. 4. Cystic left adnexal lesion measures 5.3 x 3.4 cm, difficult to delineate from adjacent fluid-filled bowel loops, however this is unchanged compared to report from exam 7 years prior. Imaging stability suggests a benign etiology, however images not available for direct comparison. After resolution of acute illness, pelvic ultrasound could be considered for characterization.   Electronically Signed    By: Rubye Oaks M.D.   On: 09/02/2015 01:34   Dg Chest 2 View  09/02/2015   CLINICAL DATA:  Weakness.  Altered mental status.  Diarrhea.  EXAM: CHEST  2 VIEW  COMPARISON:  Frontal and lateral views 08/13/2007  FINDINGS: Progressive elevation of left hemidiaphragm. There is adjacent left basilar opacity. The right lung is clear. Cardiomegaly and tortuous atherosclerotic aorta, unchanged. No large pleural effusion. No pneumothorax. No acute osseous abnormalities are seen.  IMPRESSION: Left basilar opacity. This is adjacent to elevated left hemidiaphragm. This may reflect compressive atelectasis versus pneumonia. Cardiomegaly is stable.   Electronically Signed   By: Rubye Oaks M.D.   On: 09/02/2015 00:17   Ct Head Wo Contrast  09/02/2015   CLINICAL DATA:  Weakness and altered mental status.  EXAM: CT HEAD WITHOUT CONTRAST  TECHNIQUE: Contiguous axial images were obtained from the base of the skull through the vertex without intravenous contrast.  COMPARISON:  CT 11/06/2010  FINDINGS: Generalized atrophy, most significant in the high bifrontal regions. Mild chronic small vessel ischemic change. No intracranial hemorrhage, mass effect, or midline shift. No hydrocephalus. The basilar cisterns are patent. No evidence of territorial infarct. No intracranial fluid collection. Atherosclerotic calcifications of the skullbase vasculature. Calvarium is intact. Included paranasal sinuses and mastoid air cells are well aerated.  IMPRESSION: Atrophy and chronic small vessel ischemic change. No acute intracranial abnormality.   Electronically Signed   By: Rubye Oaks M.D.   On: 09/02/2015 01:21   Dg Chest Port 1 View  09/03/2015   CLINICAL DATA:  Encounter for central line placement.  EXAM: PORTABLE CHEST - 1 VIEW  COMPARISON:  Frontal and lateral views obtained yesterday.  FINDINGS: Tip of the right central line in the mid proximal SVC. No pneumothorax. Progressive left basilar opacity and volume loss in  the left hemithorax. The heart is enlarged. No pulmonary edema.  IMPRESSION: 1. Tip of the right central line in the SVC, no pneumothorax. 2. Progressive volume loss in the left lower lobe with increased retrocardiac opacity, likely atelectasis. Stable cardiomegaly.   Electronically Signed   By: Rubye Oaks M.D.   On: 09/03/2015 01:55    Scheduled Meds: . aspirin EC  81 mg Oral Daily  . clopidogrel  75 mg Oral Daily  . heparin  5,000 Units Subcutaneous 3 times per day  . insulin aspart  0-9 Units Subcutaneous 6 times per day  . mometasone-formoterol  2 puff Inhalation BID  . pantoprazole  40 mg Oral Daily  .  piperacillin-tazobactam (ZOSYN)  IV  3.375 g Intravenous Q12H  . sodium chloride  3 mL Intravenous Q12H  . vancomycin  1,000 mg Intravenous Q72H  . [START ON 09/04/2015] Vitamin D (Ergocalciferol)  50,000 Units Oral Weekly   Continuous Infusions: . sodium chloride    . norepinephrine (LEVOPHED) Adult infusion Stopped (09/03/15 1058)    Assessment/Plan:  1. Septic shock, pneumonia. Patient is off levophed. I will continue gentle IV fluid hydration. Continue IV vancomycin and Zosyn. Since stool for C. difficile is negative I will DC Flagyl. Transfer out of the ICU. 2. Acute renal failure on chronic kidney disease stage III- continue gentle IV fluid hydration 3. Peripheral vascular disease, with gangrenous left toe, blackened eschar on left Achilles area- on aspirin and Plavix 4. Diabetes mellitus type 2- patient on sliding scale 5. Anemia- watch hemoglobin closely with IV fluid hydration 6. History of coronary artery disease 7. Hyponatremia- likely with dehydration continue normal saline. 8. Elevated troponin- demand ischemia from septic shock.  Code Status:     Code Status Orders        Start     Ordered   09-21-2015 [phone]  Do not attempt resuscitation (DNR)   Continuous    Question Answer Comment  In the event of cardiac or respiratory ARREST Do not call a "code blue"    In the event of cardiac or respiratory ARREST Do not perform Intubation, CPR, defibrillation or ACLS   In the event of cardiac or respiratory ARREST Use medication by any route, position, wound care, and other measures to relive pain and suffering. May use oxygen, suction and manual treatment of airway obstruction as needed for comfort.   Comments RN may pronounce death      September 21, 2015 [id number]     Family Communication: Grandson and family at bedside Disposition Plan: To be determined  Consultants:  Gastroenterology  Antibiotics:  IV vancomycin  IV Zosyn  Time spent: 25 minutes.  Alford Highland  [facility]

## 2015-09-03 NOTE — Progress Notes (Signed)
ANTIBIOTIC CONSULT NOTE -follow up  Pharmacy Consult for Zosyn/Vancomycin Indication: rule out sepsis  No Known Allergies  Patient Measurements: Height:  (165.1 cm) Weight: 160 lb 7.9 oz (72.8 kg) IBW/kg (Calculated) : 57 Adjusted Body Weight: 63.3 kg  Vital Signs: Temp: 97.9 F (36.6 C) (09/18 0800) Temp Source: Oral (09/18 0800) BP: 89/58 mmHg (09/18 0945) Pulse Rate: 70 (09/18 0945)  Labs:  Recent Labs  09/01/15 2341 09/02/15 0612 09/03/15 0428  WBC 29.0* 28.7* 29.1*  HGB 9.7* 9.0* 8.7*  PLT 312 286 304  CREATININE 3.16* 2.93* 2.78*   Estimated Creatinine Clearance: 12.9 mL/min (by C-G formula based on Cr of 2.78).   Microbiology: Recent Results (from the past 720 hour(s))  Urine culture     Status: None   Collection Time: 09/01/15 11:41 PM  Result Value Ref Range Status   Specimen Description URINE, RANDOM  Final   Special Requests NONE  Final   Culture NO GROWTH 2 DAYS  Final   Report Status 09/03/2015 FINAL  Final  Stool culture     Status: None (Preliminary result)   Collection Time: 09/02/15  9:10 AM  Result Value Ref Range Status   Specimen Description STOOL  Final   Special Requests NONE  Final   Culture   Final    NO CAMPYLOBACTER DETECTED NO SALMONELLA OR SHIGELLA ISOLATED    Report Status PENDING  Incomplete  MRSA PCR Screening     Status: None   Collection Time: 09/02/15  9:10 AM  Result Value Ref Range Status   MRSA by PCR NEGATIVE NEGATIVE Final    Comment:        The GeneXpert MRSA Assay (FDA approved for NASAL specimens only), is one component of a comprehensive MRSA colonization surveillance program. It is not intended to diagnose MRSA infection nor to guide or monitor treatment for MRSA infections.   C difficile quick scan w PCR reflex     Status: None   Collection Time: 09/02/15  9:10 AM  Result Value Ref Range Status   C Diff antigen NEGATIVE NEGATIVE Final   C Diff toxin NEGATIVE NEGATIVE Final   C Diff  interpretation Negative for C. difficile  Final    Medical History: Past Medical History  Diagnosis Date  . Abnormal heart rhythms   . Diabetes mellitus without complication   . Coronary artery disease   . Hyperlipidemia   . Hypertension   . Chronic kidney disease   . Varicose veins   . Peripheral vascular disease   . Anemia   . Arthritis     Medications:  Infusions:  . norepinephrine (LEVOPHED) Adult infusion 6 mcg/min (09/03/15 0841)   Assessment: 89 yof cc AMS with hx CKD III. Has not been eating/drinking well for past several days per daughter. LLE stent for PAD in August then developed wound over left heel and dry gangrene of left fifth toe was on oral abx for 21 days which she completed 2 days ago. In ED WBC 29, SCr 3.16 BUN 48, LA 4.1. CXR atelectasis vs PNA. Starting Vancomycin/Zosyn/Flagyl.   Vd 41.1 L, Ke 0.013 hr-1, T1/2 52.7 hr, predicted trough 16 mcg/mL  Goal of Therapy:  Vancomycin trough level 15-20 mcg/ml  Plan:  Expected duration 7 days with resolution of temperature and/or normalization of WBC. CrCl approximately 10 mL/min. Zosyn 3.375 gm IV Q12H EI and vancomycin 1 gm IV Q72H with stacked dosing, the second dose 36 hours after first. Hx CKD III so renal function  may improve over the next few days and we will have to adjust antibiotic dosing empirically if that happens. Trough scheduled for before third dose, which will not be quite steady state. Will reassess renal function daily.   9/18:  Scr 2.78 est crcl 12.9 ml/min. Continue current regimen. If Scr improves tomorrow, can likely adjust dosing to Q48h.  Cxs NGTD.   Merrill,Kristin A, Pharm.D. Clinical Pharmacist  09/03/2015,10:14 AM

## 2015-09-03 NOTE — Progress Notes (Signed)
   09/03/15 1230  Vitals  Temp 97.8 F (36.6 C)  Temp Source Axillary  BP (!) 87/52 mmHg  MAP (mmHg) 65  BP Location Right Arm  BP Method Automatic  Patient Position (if appropriate) Lying  Pulse Rate 67  Pulse Rate Source Monitor  ECG Heart Rate 67  Cardiac Rhythm NSR  Resp 18  Oxygen Therapy  SpO2 96 %  O2 Device Nasal Cannula  O2 Flow Rate (L/min) 2 L/min  Pre-WUA / WUA Start  [place] Agitation Sedation Scale (RASS) -1  RASS Goal 0  Pain Assessment  Pain Assessment No/denies pain  PCA/Epidural/Spinal Assessment  Respiratory Pattern Regular  Glasgow Coma Scale  Eye Opening 3  Best Verbal Response (NON-intubated) 4  Best Motor Response 5  Glasgow Coma Scale Score 12  -MD Singh notified pt output min (see Documentation) no new orders received -Pt given meds crushed in applesauce, pt tol well, no coughing or clearing throat and no pocketing of meds noted

## 2015-09-03 NOTE — Progress Notes (Signed)
   09/03/15 1650  Vitals  BP (!) 81/50 mmHg  MAP (mmHg) 61  Pulse Rate 71  ECG Heart Rate 73  Resp (!) 22  Oxygen Therapy  SpO2 98 %  levophed started due to Decreased BP

## 2015-09-04 ENCOUNTER — Inpatient Hospital Stay: Payer: Medicare (Managed Care)

## 2015-09-04 LAB — BASIC METABOLIC PANEL
Anion gap: 9 (ref 5–15)
BUN: 47 mg/dL — AB (ref 6–20)
CHLORIDE: 97 mmol/L — AB (ref 101–111)
CO2: 22 mmol/L (ref 22–32)
CREATININE: 2.74 mg/dL — AB (ref 0.44–1.00)
Calcium: 7.7 mg/dL — ABNORMAL LOW (ref 8.9–10.3)
GFR calc Af Amer: 16 mL/min — ABNORMAL LOW (ref >60)
GFR calc non Af Amer: 14 mL/min — ABNORMAL LOW (ref >60)
GLUCOSE: 164 mg/dL — AB (ref 65–99)
Potassium: 4.5 mmol/L (ref 3.5–5.1)
SODIUM: 128 mmol/L — AB (ref 135–145)

## 2015-09-04 LAB — STOOL CULTURE: CULTURE: NOT DETECTED

## 2015-09-04 LAB — CBC
HEMATOCRIT: 26.9 % — AB (ref 35.0–47.0)
Hemoglobin: 8.7 g/dL — ABNORMAL LOW (ref 12.0–16.0)
MCH: 30.1 pg (ref 26.0–34.0)
MCHC: 32.1 g/dL (ref 32.0–36.0)
MCV: 93.8 fL (ref 80.0–100.0)
PLATELETS: 324 10*3/uL (ref 150–440)
RBC: 2.87 MIL/uL — ABNORMAL LOW (ref 3.80–5.20)
RDW: 13.4 % (ref 11.5–14.5)
WBC: 21.9 10*3/uL — AB (ref 3.6–11.0)

## 2015-09-04 LAB — GLUCOSE, CAPILLARY
GLUCOSE-CAPILLARY: 208 mg/dL — AB (ref 65–99)
Glucose-Capillary: 109 mg/dL — ABNORMAL HIGH (ref 65–99)
Glucose-Capillary: 201 mg/dL — ABNORMAL HIGH (ref 65–99)
Glucose-Capillary: 234 mg/dL — ABNORMAL HIGH (ref 65–99)
Glucose-Capillary: 272 mg/dL — ABNORMAL HIGH (ref 65–99)

## 2015-09-04 LAB — PATHOLOGIST SMEAR REVIEW

## 2015-09-04 NOTE — Care Management Note (Signed)
Case Management Note  Patient Details  Name: [name] MRN: [id number] Date of Birth: 08-24-1921  Subjective/Objective: RNCM consult for home health needs. Presents from home where she is total care and cared for by her daughters. Admitted with Sepsis, UTI and PNA.             Action/Plan: Will follow progression and speak with daughters further.   Expected Discharge Date:                  Expected Discharge Plan:  Home w Home Health Services  In-House Referral:     Discharge planning Services  CM Consult  Post Acute Care Choice:    Choice offered to:     DME Arranged:    DME Agency:     HH Arranged:    HH Agency:     Status of Service:  In process, will continue to follow  Medicare Important Message Given:    Date Medicare IM Given:    Medicare IM give by:    Date Additional Medicare IM Given:    Additional Medicare Important Message give by:     If discussed at Long Length of Stay Meetings, dates discussed:    Additional Comments:  Marily Memos, RN 09/04/2015, 9:50 AM

## 2015-09-04 NOTE — Progress Notes (Signed)
Patient ID: [name], female   DOB: Apr 05, 1921, 81 y.o.   MRN: [facility]  Hospital Physicians PROGRESS NOTE  PCP: Lavetta Nielsen, MD  HPI/Subjective: Patient more alert today and answering questions. Nurse noted bigeminy and trigeminy on the monitor. Patient asking to advance diet.  Objective: Filed Vitals:   09/04/15 0800  BP: 92/55  Pulse: 73  Temp: 98.4 F (36.9 C)  Resp: 18    Filed Weights   09/02/15 0245 09/03/15 0500 09/04/15 0500  Weight: 75.161 kg (165 lb 11.2 oz) 72.8 kg (160 lb 7.9 oz) 81.3 kg (179 lb 3.7 oz)    ROS: Review of Systems  Constitutional: Negative for fever and chills.  Eyes: Negative for blurred vision.  Respiratory: Positive for cough. Negative for shortness of breath.   Cardiovascular: Negative for chest pain.  Gastrointestinal: Negative for nausea, vomiting, abdominal pain, diarrhea and constipation.  Genitourinary: Negative for dysuria.  Musculoskeletal: Negative for joint pain.  Neurological: Negative for dizziness and headaches.   Exam: Physical Exam  HENT:  Nose: No mucosal edema.  Mouth/Throat: No oropharyngeal exudate or posterior oropharyngeal edema.  Eyes: Conjunctivae, EOM and lids are normal. Pupils are equal, round, and reactive to light.  Neck: No JVD present. Carotid bruit is not present. No edema present. No thyroid mass and no thyromegaly present.  Cardiovascular: S1 normal and S2 normal.  Exam reveals no gallop.   No murmur heard. Pulses:      Dorsalis pedis pulses are 2+ on the right side, and 2+ on the left side.  Respiratory: No respiratory distress. She has no wheezes. She has rhonchi in the right lower field. She has no rales.  GI: Soft. Bowel sounds are normal. There is no tenderness.  Musculoskeletal:       Right ankle: She exhibits swelling.       Left ankle: She exhibits swelling.  Lymphadenopathy:    She has no cervical adenopathy.  Skin: Skin is warm. No rash noted. Nails show no clubbing.   Blackened fifth toe left foot. Blackened eschar left Achilles area.  Psychiatric: She has a normal mood and affect.    Data Reviewed: Basic Metabolic Panel:  Recent Labs Lab 09/01/15 2341 09/02/15 0612 09/03/15 0428 09/04/15 0346  NA 129* 129* 130* 128*  K 4.8 5.1 4.4 4.5  CL 91* 97* 98* 97*  CO2 23 21* 24 22  GLUCOSE 260* 208* 126* 164*  BUN 48* 47* 47* 47*  CREATININE 3.16* 2.93* 2.78* 2.74*  CALCIUM 8.3* 7.7* 7.8* 7.7*   Liver Function Tests:  Recent Labs Lab 09/01/15 2341  AST 28  ALT 16  ALKPHOS 78  BILITOT 0.8  PROT 6.4*  ALBUMIN 3.6    Recent Labs Lab 09/01/15 2341  LIPASE 41   CBC:  Recent Labs Lab 09/01/15 2341 09/02/15 0612 09/03/15 0428 09/04/15 0346  WBC 29.0* 28.7* 29.1* 21.9*  NEUTROABS 22.0*  --   --   --   HGB 9.7* 9.0* 8.7* 8.7*  HCT 30.0* 27.7* 26.5* 26.9*  MCV 93.5 92.9 93.9 93.8  PLT 312 286 304 324   Cardiac Enzymes:  Recent Labs Lab 09/01/15 2341 09/02/15 0612 09/02/15 1347 09/02/15 1830 09/03/15 0114  TROPONINI 0.05* 0.05* 0.06* 0.06* 0.06*    CBG:  Recent Labs Lab 09/03/15 1607 09/03/15 1945 09/03/15 2351 09/04/15 0351 09/04/15 0726  GLUCAP 113* 188* 193* 109* 201*    Recent Results (from the past 240 hour(s))  Urine culture     Status: None  Collection Time: 09/01/15 11:41 PM  Result Value Ref Range Status   Specimen Description URINE, RANDOM  Final   Special Requests NONE  Final   Culture NO GROWTH 2 DAYS  Final   Report Status 09/03/2015 FINAL  Final  Culture, blood (routine x 2)     Status: None (Preliminary result)   Collection Time: 09/02/15  1:28 AM  Result Value Ref Range Status   Specimen Description BLOOD LEFT ASSIST CONTROL  Final   Special Requests BOTTLES DRAWN AEROBIC AND ANAEROBIC 4CC  Final   Culture NO GROWTH 2 DAYS  Final   Report Status PENDING  Incomplete  Culture, blood (routine x 2)     Status: None (Preliminary result)   Collection Time: 09/02/15  1:28 AM  Result Value Ref  Range Status   Specimen Description BLOOD RIGHT ASSIST CONTROL  Final   Special Requests BOTTLES DRAWN AEROBIC AND ANAEROBIC 4CC  Final   Culture NO GROWTH 2 DAYS  Final   Report Status PENDING  Incomplete  Stool culture     Status: None (Preliminary result)   Collection Time: 09/02/15  9:10 AM  Result Value Ref Range Status   Specimen Description STOOL  Final   Special Requests NONE  Final   Culture   Final    NO CAMPYLOBACTER DETECTED NO SALMONELLA OR SHIGELLA ISOLATED No Pathogenic E. coli detected    Report Status PENDING  Incomplete  MRSA PCR Screening     Status: None   Collection Time: 09/02/15  9:10 AM  Result Value Ref Range Status   MRSA by PCR NEGATIVE NEGATIVE Final    Comment:        The GeneXpert MRSA Assay (FDA approved for NASAL specimens only), is one component of a comprehensive MRSA colonization surveillance program. It is not intended to diagnose MRSA infection nor to guide or monitor treatment for MRSA infections.   C difficile quick scan w PCR reflex     Status: None   Collection Time: 09/02/15  9:10 AM  Result Value Ref Range Status   C Diff antigen NEGATIVE NEGATIVE Final   C Diff toxin NEGATIVE NEGATIVE Final   C Diff interpretation Negative for C. difficile  Final     Studies: Dg Chest Port 1 View  09/03/2015   CLINICAL DATA:  Encounter for central line placement.  EXAM: PORTABLE CHEST - 1 VIEW  COMPARISON:  Frontal and lateral views obtained yesterday.  FINDINGS: Tip of the right central line in the mid proximal SVC. No pneumothorax. Progressive left basilar opacity and volume loss in the left hemithorax. The heart is enlarged. No pulmonary edema.  IMPRESSION: 1. Tip of the right central line in the SVC, no pneumothorax. 2. Progressive volume loss in the left lower lobe with increased retrocardiac opacity, likely atelectasis. Stable cardiomegaly.   Electronically Signed   By: Rubye Oaks M.D.   On: 09/03/2015 01:55    Scheduled Meds: .  aspirin EC  81 mg Oral Daily  . clopidogrel  75 mg Oral Daily  . heparin  5,000 Units Subcutaneous 3 times per day  . insulin aspart  0-9 Units Subcutaneous 6 times per day  . mometasone-formoterol  2 puff Inhalation BID  . pantoprazole  40 mg Oral Daily  . piperacillin-tazobactam (ZOSYN)  IV  3.375 g Intravenous Q12H  . sodium chloride  3 mL Intravenous Q12H  . vancomycin  1,000 mg Intravenous Q72H  . Vitamin D (Ergocalciferol)  50,000 Units Oral Weekly  Continuous Infusions: . sodium chloride 30 mL/hr at 09/04/15 0700  . norepinephrine 10 mcg/min (09/04/15 0700)    Assessment/Plan:  1. Septic shock, pneumonia. Patient back on levophed. I will continue gentle IV fluid hydration. Continue IV vancomycin and Zosyn.  2. Acute renal failure on chronic kidney disease stage III- continue gentle IV fluid hydration. Creatinine is very slow to improve. 3. Peripheral vascular disease, with gangrenous left toe, blackened eschar on left Achilles area- on aspirin and Plavix 4. Diabetes mellitus type 2- patient on sliding scale 5. Anemia- watch hemoglobin closely with IV fluid hydration 6. History of coronary artery disease 7. Hyponatremia- likely with dehydration continue normal saline. 8. Elevated troponin- demand ischemia from septic shock. 9. Bigeminy and trigeminy- likely induced with pressors.  Code Status:     Code Status Orders        Start     Ordered   09-24-15 [phone]  Do not attempt resuscitation (DNR)   Continuous    Question Answer Comment  In the event of cardiac or respiratory ARREST Do not call a "code blue"   In the event of cardiac or respiratory ARREST Do not perform Intubation, CPR, defibrillation or ACLS   In the event of cardiac or respiratory ARREST Use medication by any route, position, wound care, and other measures to relive pain and suffering. May use oxygen, suction and manual treatment of airway obstruction as needed for comfort.   Comments RN may pronounce  death      24-Sep-2015 0611     Family Communication: Family at bedside Disposition Plan: To be determined  Consultants:  Gastroenterology  Antibiotics:  IV vancomycin  IV Zosyn  Time spent: 25 minutes.  Alford Highland  [facility]

## 2015-09-04 NOTE — Clinical Social Work Note (Signed)
RN CM informed CSW that patient is cared for 24/7 by her daughters and is at home. [facility] is also followed by [facility] and RN CM is in communication with Selena Batten from [facility] who will be providing services for patient home. York Spaniel MSW,LCSW [phone]

## 2015-09-04 NOTE — Consult Note (Signed)
I will sign off, reconsult if further GI imput is needed.

## 2015-09-04 NOTE — Progress Notes (Signed)
[facility]  ROUNDING NOTE   Subjective:  Pt seen at bedside.  Does follow commands. Has underlying dementia.  UOP 520cc over the past 24 hours.  Cr about the same at 2.74.     Objective:  Vital signs in last 24 hours:  Temp:  [97.6 F (36.4 C)-98.4 F (36.9 C)] 98.4 F (36.9 C) (09/19 0800) Pulse Rate:  [59-91] 73 (09/19 0800) Resp:  [14-25] 18 (09/19 0800) BP: (60-132)/(32-98) 92/55 mmHg (09/19 0800) SpO2:  [88 %-100 %] 96 % (09/19 0800) Weight:  [81.3 kg (179 lb 3.7 oz)] 81.3 kg (179 lb 3.7 oz) (09/19 0500)  Weight change: 8.5 kg (18 lb 11.8 oz) Filed Weights   09/02/15 0245 09/03/15 0500 09/04/15 0500  Weight: 75.161 kg (165 lb 11.2 oz) 72.8 kg (160 lb 7.9 oz) 81.3 kg (179 lb 3.7 oz)    Intake/Output: I/O last 3 completed shifts: In: 1705.3 [P.O.:60; I.V.:1095.3; IV Piggyback:550] Out: 520 [Urine:520]   Intake/Output this shift:  Total I/O In: 67.5 [I.V.:67.5] Out: -   Physical Exam: General: Elderly female, NAD  Head: Normocephalic, atraumatic. Moist oral mucosal membranes  Eyes: Anicteric, eyes open  Neck: Supple, trachea midline  Lungs:  Clear to auscultation normal effort  Heart: S1S2 no rubs  Abdomen:  Soft, nontender, BS present   Extremities: no peripheral edema.  Neurologic: Nonfocal, moving all four extremities  Skin: No lesions       Basic Metabolic Panel:  Recent Labs Lab 09/01/15 2341 09/02/15 0612 09/03/15 0428 09/04/15 0346  NA 129* 129* 130* 128*  K 4.8 5.1 4.4 4.5  CL 91* 97* 98* 97*  CO2 23 21* 24 22  GLUCOSE 260* 208* 126* 164*  BUN 48* 47* 47* 47*  CREATININE 3.16* 2.93* 2.78* 2.74*  CALCIUM 8.3* 7.7* 7.8* 7.7*    Liver Function Tests:  Recent Labs Lab 09/01/15 2341  AST 28  ALT 16  ALKPHOS 78  BILITOT 0.8  PROT 6.4*  ALBUMIN 3.6    Recent Labs Lab 09/01/15 2341  LIPASE 41   No results for input(s): AMMONIA in the last 168 hours.  CBC:  Recent Labs Lab 09/01/15 2341 09/02/15 0612  09/03/15 0428 09/04/15 0346  WBC 29.0* 28.7* 29.1* 21.9*  NEUTROABS 22.0*  --   --   --   HGB 9.7* 9.0* 8.7* 8.7*  HCT 30.0* 27.7* 26.5* 26.9*  MCV 93.5 92.9 93.9 93.8  PLT 312 286 304 324    Cardiac Enzymes:  Recent Labs Lab 09/01/15 2341 09/02/15 0612 09/02/15 1347 09/02/15 1830 09/03/15 0114  TROPONINI 0.05* 0.05* 0.06* 0.06* 0.06*    BNP: Invalid input(s): POCBNP  CBG:  Recent Labs Lab 09/03/15 1607 09/03/15 1945 09/03/15 2351 09/04/15 0351 09/04/15 0726  GLUCAP 113* 188* 193* 109* 201*    Microbiology: Results for orders placed or performed during the hospital encounter of 09/01/15  Urine culture     Status: None   Collection Time: 09/01/15 11:41 PM  Result Value Ref Range Status   Specimen Description URINE, RANDOM  Final   Special Requests NONE  Final   Culture NO GROWTH 2 DAYS  Final   Report Status 09/03/2015 FINAL  Final  Culture, blood (routine x 2)     Status: None (Preliminary result)   Collection Time: 09/02/15  1:28 AM  Result Value Ref Range Status   Specimen Description BLOOD LEFT ASSIST CONTROL  Final   Special Requests BOTTLES DRAWN AEROBIC AND ANAEROBIC 4CC  Final   Culture NO  GROWTH 2 DAYS  Final   Report Status PENDING  Incomplete  Culture, blood (routine x 2)     Status: None (Preliminary result)   Collection Time: 09/02/15  1:28 AM  Result Value Ref Range Status   Specimen Description BLOOD RIGHT ASSIST CONTROL  Final   Special Requests BOTTLES DRAWN AEROBIC AND ANAEROBIC 4CC  Final   Culture NO GROWTH 2 DAYS  Final   Report Status PENDING  Incomplete  Stool culture     Status: None (Preliminary result)   Collection Time: 09/02/15  9:10 AM  Result Value Ref Range Status   Specimen Description STOOL  Final   Special Requests NONE  Final   Culture   Final    NO CAMPYLOBACTER DETECTED NO SALMONELLA OR SHIGELLA ISOLATED No Pathogenic E. coli detected    Report Status PENDING  Incomplete  MRSA PCR Screening     Status: None    Collection Time: 09/02/15  9:10 AM  Result Value Ref Range Status   MRSA by PCR NEGATIVE NEGATIVE Final    Comment:        The GeneXpert MRSA Assay (FDA approved for NASAL specimens only), is one component of a comprehensive MRSA colonization surveillance program. It is not intended to diagnose MRSA infection nor to guide or monitor treatment for MRSA infections.   C difficile quick scan w PCR reflex     Status: None   Collection Time: 09/02/15  9:10 AM  Result Value Ref Range Status   C Diff antigen NEGATIVE NEGATIVE Final   C Diff toxin NEGATIVE NEGATIVE Final   C Diff interpretation Negative for C. difficile  Final    Coagulation Studies: No results for input(s): LABPROT, INR in the last 72 hours.  Urinalysis:  Recent Labs  09/01/15 2341  COLORURINE AMBER*  LABSPEC 1.024  PHURINE 5.0  GLUCOSEU NEGATIVE  HGBUR NEGATIVE  BILIRUBINUR 1+*  KETONESUR TRACE*  PROTEINUR 100*  NITRITE NEGATIVE  LEUKOCYTESUR NEGATIVE      Imaging: Dg Chest Port 1 View  09/03/2015   CLINICAL DATA:  Encounter for central line placement.  EXAM: PORTABLE CHEST - 1 VIEW  COMPARISON:  Frontal and lateral views obtained yesterday.  FINDINGS: Tip of the right central line in the mid proximal SVC. No pneumothorax. Progressive left basilar opacity and volume loss in the left hemithorax. The heart is enlarged. No pulmonary edema.  IMPRESSION: 1. Tip of the right central line in the SVC, no pneumothorax. 2. Progressive volume loss in the left lower lobe with increased retrocardiac opacity, likely atelectasis. Stable cardiomegaly.   Electronically Signed   By: Jeb Levering M.D.   On: 09/03/2015 01:55     Medications:   . sodium chloride 30 mL/hr at 09/04/15 0700  . norepinephrine 10 mcg/min (09/04/15 0700)   . aspirin EC  81 mg Oral Daily  . clopidogrel  75 mg Oral Daily  . heparin  5,000 Units Subcutaneous 3 times per day  . insulin aspart  0-9 Units Subcutaneous 6 times per day  .  mometasone-formoterol  2 puff Inhalation BID  . pantoprazole  40 mg Oral Daily  . piperacillin-tazobactam (ZOSYN)  IV  3.375 g Intravenous Q12H  . sodium chloride  3 mL Intravenous Q12H  . vancomycin  1,000 mg Intravenous Q72H  . Vitamin D (Ergocalciferol)  50,000 Units Oral Weekly   acetaminophen **OR** acetaminophen, albuterol, ondansetron **OR** ondansetron (ZOFRAN) IV, oxyCODONE-acetaminophen  Assessment/ Plan:  81 y.o. female  with a PMHX of diabetes  type 2, chronic kidney disease, hypertension, peripheral vascular disease status post femoropopliteal bypass, dementia, COPD, anemia, was admitted on 09/01/2015 with altered mental status.   1. ARF, due to ATN/CKD stage III baseline Cr 1.01/egfr 54/proteinuria. Home medication list includes quinapril, amlodipine which could certainly cause ATN in the setting of hypotension and poor by mouth intake.  -  Renal function only mariginally better today.  Hasnt' been eating well.  Will increase 0.9 NS to 50cc/hr. Follow up Cr in AM.  -  Check renal US, spep/upep/ana.  2.  Hypotension:  Continue norepinephrine gtt to maintain map of 65 or greater.   3.  Hyponatremia: na down to 128 this AM, SIADH vs volume depletion, increase 0.9 NS to 50cc/hr.   LOS: 2 LATEEF, MUNSOOR 9/19/20169:41 AM

## 2015-09-04 NOTE — Progress Notes (Signed)
Initial Nutrition Assessment    INTERVENTION:   Meals and Snacks: Cater to patient preferences, small snacks between meals Medical Food Supplement Therapy: if po intake inadequate on follow-up, recommend addition of nutritional supplement  NUTRITION DIAGNOSIS:   Inadequate oral intake related to acute illness, poor appetite as evidenced by per patient/family report.  GOAL:   Patient will meet greater than or equal to 90% of their needs  MONITOR:    (Energy Intake, Anthropometrics, Digestive System, Electrolyte/Renal Profile, Glucose Profile)   ASSESSMENT:    Pt admitted with acute encephalopathy, sepsis, hyponatremia, dehydration, diarrrhea (cdiff negative); pt with underlying dementia, daughters at bedside  Past Medical History  Diagnosis Date  . Abnormal heart rhythms   . Diabetes mellitus without complication   . Coronary artery disease   . Hyperlipidemia   . Hypertension   . Chronic kidney disease   . Varicose veins   . Peripheral vascular disease   . Anemia   . Arthritis    Diet Order:  DIET SOFT Room service appropriate?: Yes; Fluid consistency:: Thin; SLP eval pending  Energy Intake: pt did not eat breakfast this AM, per daughter this was due to the fact that pt did not like the puree, thickened liquid tray. Diet just advanced on visit this AM  Food and Nutrition Related History: pt lives with her Daughter, 2 daughters at bedside on visit this AM. They report that pt had been eating well prior to Thursday. On Thursday, pt stated holding foods in mouth, spitting them out but would still take liquids. Prior to this, pt eating 2-3 meals per day, 1-2 snacks. Not taking any nutritional supplements like Ensure/Boost  Digestive System: no N/V, pt wears dentures, last BM 9/17  Electrolyte and Renal Profile:  Recent Labs Lab 09/02/15 0612 09/03/15 0428 09/04/15 0346  BUN 47* 47* 47*  CREATININE 2.93* 2.78* 2.74*  NA 129* 130* 128*  K 5.1 4.4 4.5   Glucose  Profile:  Recent Labs  09/04/15 0351 09/04/15 0726 09/04/15 1201  GLUCAP 109* 201* 208*   Protein Profile:  Recent Labs Lab 09/01/15 2341  ALBUMIN 3.6   Meds: NS at 50 ml/hr, ss novolog, levophed  Skin:  Reviewed, no issues  Nutrition Focused Physical Exam:  Unable to complete Nutrition-Focused physical exam at this time. Pt having test performed on visit today  Height:  Ht Readings from Last 1 Encounters:  09/03/15  (1.651 m)    Weight:  daughter report that pt UBW is between 152-157 pounds and has been this for the last several years  Wt Readings from Last 1 Encounters:  09/04/15 179 lb 3.7 oz (81.3 kg)   Wt Readings from Last 10 Encounters:  09/04/15 179 lb 3.7 oz (81.3 kg)  07/24/15 151 lb (68.493 kg)  07/13/15 151 lb (68.493 kg)   Total Energy Estimated Needs  1400-1656 kcals (BEE 980, 1.3 AF, 1.1-1.3 IF) using IBW 57 kg 1400-1656 kcals (BEE 980, 1.3 AF, 1.1-1.3 IF) using IBW 57 kg at 1232 on 09/04/15 by Osa Craver, RD   Total Protein Estimated Needs  57-68 g (1.0-1.2 g/kg)  57-68 g (1.0-1.2 g/kg)  at 1232 on 09/04/15 by Osa Craver, RD   Total Fluid Estimated Needs  1425-1710 mL (25-30 ml/kg)  1425-1710 mL (25-30 ml/kg)  at 1232 on 09/04/15 by Osa Craver, RD         BMI:  Body mass index is 29.83 kg/(m^2).  MODERATE Care Level  [facility] MS, [place], LDN [phone] Pager,

## 2015-09-04 NOTE — Care Management Important Message (Signed)
Important Message  Patient Details  Name: [name] MRN: [id number] Date of Birth: 09-24-1921   Medicare Important Message Given:  Yes-second notification given    Verita Schneiders Allmond 09/04/2015, 2:34 PM

## 2015-09-04 NOTE — Care Management Important Message (Signed)
Important Message  Patient Details  Name: [name] MRN: [id number] Date of Birth: 12-31-1920   Medicare Important Message Given:       Verita Schneiders Allmond 09/04/2015, 2:31 PM

## 2015-09-04 NOTE — Progress Notes (Signed)
Inpatient Diabetes Program Recommendations  AACE/ADA: New Consensus Statement on Inpatient Glycemic Control (2015)  Target Ranges:  Prepandial:   less than 140 mg/dL      Peak postprandial:   less than 180 mg/dL (1-2 hours)      Critically ill patients:  140 - 180 mg/dL    Results for TAMIAH, DYSART (MRN [id number]) as of 09/04/2015 12:25  Ref. Range 09/03/2015 23:51 09/04/2015 03:51 09/04/2015 07:26 09/04/2015 12:01  Glucose-Capillary Latest Ref Range: 65-99 mg/dL 782 (H) 956 (H) 213 (H) 208 (H)    Admit with: Sepsis from UTI/ Pneumonia  History: DM, CKD, Dementia  Home DM Meds: Glipizide 5 mg bid  Current DM Orders: Novolog Sensitive SSI (0-9 units) Q4 hours    -Patient now getting soft PO diet.  -Glucose levels >200 mg/dl today.   MD- Please consider the following in-hospital insulin adjustments:  Change/Increase Novolog SSI to Moderate scale (0-15 units) TID AC + HS    Will follow Ambrose Finland RN, MSN, CDE Diabetes Coordinator Inpatient Glycemic Control Team Team Pager: [phone] (8a-5p)

## 2015-09-04 NOTE — Care Management Note (Signed)
Case Management Note  Patient Details  Name: VENITA SENG MRN: [id number] Date of Birth: Jun 19, 1921  Subjective/Objective:    [facility] a [facility]. Spoke with Selena Batten, home care coordinator [phone]). [facility] T[facility] where she gets SN, CSW, PT, OT and SLP as needed. [facility] will pick her up at discharge.                 Action/Plan: Home with [facility]  Expected Discharge Date:                  Expected Discharge Plan:  Home w Home Health Services  In-House Referral:     Discharge planning Services  CM Consult  Post Acute Care Choice:    Choice offered to:     DME Arranged:    DME Agency:     HH Arranged:    HH Agency:     Status of Service:  In process, will continue to follow  Medicare Important Message Given:    Date Medicare IM Given:    Medicare IM give by:    Date Additional Medicare IM Given:    Additional Medicare Important Message give by:     If discussed at Long Length of Stay Meetings, dates discussed:    Additional Comments:  Marily Memos, RN 09/04/2015, 11:45 AM

## 2015-09-04 NOTE — Progress Notes (Signed)
 Vein and Vascular Surgery  Daily Progress Note   Subjective  - * No surgery found *  Remains critically ill, confused Seems more labored and tachypnic Foot not really hurting her much Renal function remains poor  Objective Filed Vitals:   09/04/15 1100 09/04/15 1200 09/04/15 1300 09/04/15 1400  BP: 112/62 110/65 94/55 97/58   Pulse: 78 80 80 80  Temp:  98 F (36.7 C)    TempSrc:  Oral    Resp: Height:      Weight:      SpO2: 94% 95% 96% 98%    Intake/Output Summary (Last 24 hours) at 09/04/15 1510 Last data filed at 09/04/15 0800  Gross per 24 hour  Intake 1148.4 ml  Output    195 ml  Net  953.4 ml    PULM  Coarse BS, tachypnic CV  RRR with many PVCs VASC  Left heel ulcer and dry gangrene of fifth toe which has been stable for several weeks now.  Laboratory CBC    Component Value Date/Time   WBC 21.9* 09/04/2015 0346   HGB 8.7* 09/04/2015 0346   HCT 26.9* 09/04/2015 0346   PLT 324 09/04/2015 0346    BMET    Component Value Date/Time   NA 128* 09/04/2015 0346   K 4.5 09/04/2015 0346   CL 97* 09/04/2015 0346   CO2 22 09/04/2015 0346   GLUCOSE 164* 09/04/2015 0346   BUN 47* 09/04/2015 0346   CREATININE 2.74* 09/04/2015 0346   CALCIUM 7.7* 09/04/2015 0346   GFRNONAA 14* 09/04/2015 0346   GFRAA 16* 09/04/2015 0346    Assessment/Planning:    Sepsis with MSOF.  Requiring pressors again.  Has ARF that is really not improving much.    Pneumonia.  Likely source of her sepsis.  Left foot ulceration and fifth toe gangrene.  Stable and not changed.  Pressors and low BP make her risk of thrombosis of her LLE intervention much higher.    Continue antiplatelets if at all possible and not bleeding.    Use pressure avoiding devices, heel protectors/ pads  No other vascular recs at this point    DEW,JASON  09/04/2015, 3:10 PM

## 2015-09-04 NOTE — Plan of Care (Signed)
Problem: SLP Dysphagia Goals Goal: Misc Dysphagia Goal Pt will safely tolerate po diet of least restrictive consistency w/ no overt s/s of aspiration noted by Staff/pt/family x3 sessions.    

## 2015-09-04 NOTE — Care Management Important Message (Signed)
Important Message  Patient Details  Name: COLLYNS MCQUIGG MRN: [id number] Date of Birth: [birth year]   Medicare Important Message Given:       Olegario Messier A Allmond 09/04/2015, 2:30 PM

## 2015-09-04 NOTE — Evaluation (Signed)
Clinical/Bedside Swallow Evaluation Patient Details  Name: [name] MRN: [id number] Date of Birth: 26-Jun-1921  Today's Date: 09/04/2015 Time: SLP Start Time (ACUTE ONLY): 1430 SLP Stop Time (ACUTE ONLY): 1520 SLP Time Calculation (min) (ACUTE ONLY): 50 min  Past Medical History:  Past Medical History  Diagnosis Date  . Abnormal heart rhythms   . Diabetes mellitus without complication   . Coronary artery disease   . Hyperlipidemia   . Hypertension   . Chronic kidney disease   . Varicose veins   . Peripheral vascular disease   . Anemia   . Arthritis    Past Surgical History:  Past Surgical History  Procedure Laterality Date  . Appendectomy    . Abdominal hysterectomy    . Cataract surgery    . Peripheral vascular catheterization Left 07/13/2015    Procedure: Lower Extremity Angiography;  Surgeon: Annice Needy, MD;  Location: [facility];  Service: Cardiovascular;  Laterality: Left;  . Peripheral vascular catheterization  07/13/2015    Procedure: Lower Extremity Intervention;  Surgeon: Annice Needy, MD;  Location: [facility];  Service: Cardiovascular;;  . Peripheral vascular catheterization Left 07/24/2015    Procedure: Lower Extremity Angiography;  Surgeon: Annice Needy, MD;  Location: [facility];  Service: Cardiovascular;  Laterality: Left;  . Peripheral vascular catheterization  07/24/2015    Procedure: Lower Extremity Intervention;  Surgeon: Annice Needy, MD;  Location: [facility];  Service: Cardiovascular;;   HPI:  [name] is a 81 y.o. female with a known history of diabetes mellitus type 2, CK D stage III, hypertension, peripheral arterial disease status post femoropopliteal bypass, dementia, COPD, chronic anemia, on DO NOT RESUSCITATE status brought in with the complaints of decreased mental status since yesterday. According to the patient's daughters, she was not feeling well and not eating or drinking well for the past few days and was noted  to have decreased mental status yesterday hence brought to the emergency room for further evaluation. [name] has improved in her mental status and was placed on a Soft diet by MD. [name] reproted [name] has been exhibiting oral phase s/s of dysphagia most notibly lengthy masticating and expectorating her foods/meats. [name] denied any s/s of aspiration when drinking thin liquids.    Assessment / Plan / Recommendation Clinical Impression  [name] appears to adequately tolerate trials of thin liquids and soft solids w/ no significant orophayrngeal phase dysphagia noted; no overt s/s of aspiration noted. However, suspect [name]'s baseline declined Cognitive status/Dementia is impacting [name]'s oral phase (mastication) w/ certain textures of foods such as meats/breads. [name] has noted [name] has been chewing for a lengthy period of time then expectorating meat boluses of foods. When moistened, broken-down food trials were offered, [name] demo. no oral phase deficits and swallowed timely. [name] required feeding assistance w/ po's. [name] would benefit from a modified diet w/ aspiration precautions and feeding assistance at meals. ST will f/u next 1-3 days for toleration of diet and education w/ [name].    Aspiration Risk   (reduced)    Diet Recommendation Dysphagia 3 (Mech soft);Thin   Medication Administration: Whole meds with liquid (as tolerates) Compensations: Minimize environmental distractions;Slow rate;Small sips/bites;Follow solids with liquid;Check for pocketing    Other  Recommendations Recommended Consults:  (none at this time) Oral Care Recommendations: Oral care BID;Staff/trained caregiver to provide oral care   Follow Up Recommendations       Frequency and Duration min 2x/week  1 week  Pertinent Vitals/Pain None noted by [name] or [name]    SLP Swallow Goals  see care plan   Swallow Study Prior Functional Status   resided w/ [name] who provided all ADL care for [name] per report. [name] denied any s/s of dysphagia prior to ~1-2 weeks ago  when [name] began demo. min. oral phase deficits.     General Date of Onset: 09/01/15 Other Pertinent Information: [name] is a 81 y.o. female with a known history of diabetes mellitus type 2, CK D stage III, hypertension, peripheral arterial disease status post femoropopliteal bypass, dementia, COPD, chronic anemia, on DO NOT RESUSCITATE status brought in with the complaints of decreased mental status since yesterday. According to the patient's daughters, she was not feeling well and not eating or drinking well for the past few days and was noted to have decreased mental status yesterday hence brought to the emergency room for further evaluation. [name] has improved in her mental status and was placed on a Soft diet by MD. [name] reproted [name] has been exhibiting oral phase s/s of dysphagia most notibly lengthy masticating and expectorating her foods/meats. [name] denied any s/s of aspiration when drinking thin liquids.  Type of Study: Bedside swallow evaluation Previous Swallow Assessment: none Diet Prior to this Study: Regular;Thin liquids Temperature Spikes Noted: No (wbc elevated; noted gangrene status) Respiratory Status: Supplemental O2 delivered via (comment) (2 liters) History of Recent Intubation: No Behavior/Cognition: Alert;Cooperative;Pleasant mood;Confused;Requires cueing;Distractible Oral Cavity - Dentition: Adequate natural dentition/normal for age;Missing dentition Self-Feeding Abilities: Needs assist;Needs set up;Total assist ([name] lets [name] feed her) Patient Positioning: Upright in bed Baseline Vocal Quality: Normal;Low vocal intensity Volitional Cough: Cognitively unable to elicit Volitional Swallow: Able to elicit    Oral/Motor/Sensory Function Overall Oral Motor/Sensory Function: Appears within functional limits for tasks assessed (unable to formally assess but appeared wfl w/ po trials) Labial ROM: Within Functional Limits Labial Symmetry: Within Functional Limits Lingual ROM: Within Functional  Limits Lingual Symmetry: Within Functional Limits Facial Symmetry: Within Functional Limits   Ice Chips Ice chips: Within functional limits Presentation: Spoon (fed; 2 trials)   Thin Liquid Thin Liquid: Within functional limits Presentation: Straw (assisted; ~3 ozs total)    Nectar Thick Nectar Thick Liquid: Not tested   Honey Thick Honey Thick Liquid: Not tested   Puree Puree: Within functional limits Presentation: Spoon (fed; 9-10 trials)   Solid   GO    Solid: Impaired Presentation:  (fed; 5 trials) Oral Phase Impairments: Poor awareness of bolus (expectorated 1 piece - not wanting to chew it) Oral Phase Functional Implications:  (expectoration) Pharyngeal Phase Impairments:  (none)      Jerilynn Som, MS, CCC-SLP  Watson,Katherine 09/04/2015,3:26 PM

## 2015-09-05 LAB — BASIC METABOLIC PANEL
ANION GAP: 10 (ref 5–15)
BUN: 46 mg/dL — ABNORMAL HIGH (ref 6–20)
CHLORIDE: 101 mmol/L (ref 101–111)
CO2: 21 mmol/L — AB (ref 22–32)
Calcium: 8 mg/dL — ABNORMAL LOW (ref 8.9–10.3)
Creatinine, Ser: 1.84 mg/dL — ABNORMAL HIGH (ref 0.44–1.00)
GFR calc non Af Amer: 23 mL/min — ABNORMAL LOW (ref >60)
GFR, EST AFRICAN AMERICAN: 26 mL/min — AB (ref >60)
Glucose, Bld: 149 mg/dL — ABNORMAL HIGH (ref 65–99)
POTASSIUM: 3.9 mmol/L (ref 3.5–5.1)
Sodium: 132 mmol/L — ABNORMAL LOW (ref 135–145)

## 2015-09-05 LAB — PROTEIN ELECTROPHORESIS, SERUM
A/G RATIO SPE: 1.6 (ref 0.7–1.7)
ALBUMIN ELP: 3.3 g/dL (ref 2.9–4.4)
ALPHA-1-GLOBULIN: 0.3 g/dL (ref 0.0–0.4)
ALPHA-2-GLOBULIN: 0.7 g/dL (ref 0.4–1.0)
Beta Globulin: 0.7 g/dL (ref 0.7–1.3)
GLOBULIN, TOTAL: 2.1 g/dL — AB (ref 2.2–3.9)
Gamma Globulin: 0.5 g/dL (ref 0.4–1.8)
Total Protein ELP: 5.4 g/dL — ABNORMAL LOW (ref 6.0–8.5)

## 2015-09-05 LAB — GLUCOSE, CAPILLARY
GLUCOSE-CAPILLARY: 161 mg/dL — AB (ref 65–99)
Glucose-Capillary: 149 mg/dL — ABNORMAL HIGH (ref 65–99)
Glucose-Capillary: 160 mg/dL — ABNORMAL HIGH (ref 65–99)
Glucose-Capillary: 166 mg/dL — ABNORMAL HIGH (ref 65–99)
Glucose-Capillary: 175 mg/dL — ABNORMAL HIGH (ref 65–99)
Glucose-Capillary: 214 mg/dL — ABNORMAL HIGH (ref 65–99)

## 2015-09-05 MED ORDER — VANCOMYCIN HCL IN DEXTROSE 750-5 MG/150ML-% IV SOLN
750.0000 mg | INTRAVENOUS | Status: DC
  Administered 2015-09-05: 750 mg via INTRAVENOUS
  Filled 2015-09-05: qty 150

## 2015-09-05 MED ORDER — BUDESONIDE 0.25 MG/2ML IN SUSP
0.2500 mg | Freq: Two times a day (BID) | RESPIRATORY_TRACT | Status: DC
  Administered 2015-09-05 – 2015-09-09 (×8): 0.25 mg via RESPIRATORY_TRACT
  Filled 2015-09-05 (×9): qty 2

## 2015-09-05 NOTE — Progress Notes (Signed)
ANTIBIOTIC CONSULT NOTE -follow up  Pharmacy Consult for Zosyn/Vancomycin Indication: rule out sepsis  No Known Allergies  Patient Measurements: Height:  (165.1 cm) Weight: 176 lb 2.4 oz (79.9 kg) IBW/kg (Calculated) : 57 Adjusted Body Weight: 63.3 kg  Vital Signs: Temp: 97.6 F (36.4 C) (09/20 0400) Temp Source: Axillary (09/20 0400) BP: 117/68 mmHg (09/20 0630) Pulse Rate: 84 (09/20 0654)  Labs:  Recent Labs  09/03/15 0428 09/04/15 0346 09/05/15 0400  WBC 29.1* 21.9*  --   HGB 8.7* 8.7*  --   PLT 304 324  --   CREATININE 2.78* 2.74* 1.84*   Estimated Creatinine Clearance: 20.4 mL/min (by C-G formula based on Cr of 1.84).   Microbiology: Recent Results (from the past 720 hour(s))  Urine culture     Status: None   Collection Time: 09/01/15 11:41 PM  Result Value Ref Range Status   Specimen Description URINE, RANDOM  Final   Special Requests NONE  Final   Culture NO GROWTH 2 DAYS  Final   Report Status 09/03/2015 FINAL  Final  Culture, blood (routine x 2)     Status: None (Preliminary result)   Collection Time: 09/02/15  1:28 AM  Result Value Ref Range Status   Specimen Description BLOOD LEFT ASSIST CONTROL  Final   Special Requests BOTTLES DRAWN AEROBIC AND ANAEROBIC 4CC  Final   Culture NO GROWTH 2 DAYS  Final   Report Status PENDING  Incomplete  Culture, blood (routine x 2)     Status: None (Preliminary result)   Collection Time: 09/02/15  1:28 AM  Result Value Ref Range Status   Specimen Description BLOOD RIGHT ASSIST CONTROL  Final   Special Requests BOTTLES DRAWN AEROBIC AND ANAEROBIC 4CC  Final   Culture NO GROWTH 2 DAYS  Final   Report Status PENDING  Incomplete  Stool culture     Status: None   Collection Time: 09/02/15  9:10 AM  Result Value Ref Range Status   Specimen Description STOOL  Final   Special Requests NONE  Final   Culture   Final    NO CAMPYLOBACTER DETECTED NO SALMONELLA OR SHIGELLA ISOLATED No Pathogenic E. coli detected     Report Status 09/04/2015 FINAL  Final  MRSA PCR Screening     Status: None   Collection Time: 09/02/15  9:10 AM  Result Value Ref Range Status   MRSA by PCR NEGATIVE NEGATIVE Final    Comment:        The GeneXpert MRSA Assay (FDA approved for NASAL specimens only), is one component of a comprehensive MRSA colonization surveillance program. It is not intended to diagnose MRSA infection nor to guide or monitor treatment for MRSA infections.   C difficile quick scan w PCR reflex     Status: None   Collection Time: 09/02/15  9:10 AM  Result Value Ref Range Status   C Diff antigen NEGATIVE NEGATIVE Final   C Diff toxin NEGATIVE NEGATIVE Final   C Diff interpretation Negative for C. difficile  Final    Medical History: Past Medical History  Diagnosis Date  . Abnormal heart rhythms   . Diabetes mellitus without complication   . Coronary artery disease   . Hyperlipidemia   . Hypertension   . Chronic kidney disease   . Varicose veins   . Peripheral vascular disease   . Anemia   . Arthritis     Medications:  Infusions:  . sodium chloride 50 mL/hr at 09/04/15 1156  .  norepinephrine 2.5 mcg/min (09/05/15 1610)   Assessment: 89 yof cc AMS with hx CKD III. Has not been eating/drinking well for past several days per daughter. LLE stent for PAD in August then developed wound over left heel and dry gangrene of left fifth toe was on oral abx for 21 days which she completed 2 days ago. CXR atelectasis vs PNA. Starting Vancomycin/Zosyn/Flagyl.   Ke: 0.021 Vd: 46.3  Goal of Therapy:  Vancomycin trough level 15-20 mcg/ml  Plan:  Expected duration 7 days with resolution of temperature and/or normalization of WBC. Serum creatinine has improved so will increase vancomycin dosing to 750 mg iv q 36 hours. Plan to check a trough at steady-state depending on whether renal function requires further adjustments prior to this point.  Will hold off on increasing Zosyn dosing at this point as  CrCl is borderline for this increase. Will f/u AM labs.   Luisa Hart D, Pharm.D. Clinical Pharmacist  09/05/2015,7:17 AM

## 2015-09-05 NOTE — Progress Notes (Signed)
Patient ID: [name], female   DOB: 12-20-1920, 81 y.o.   MRN: [id number]  [facility] PROGRESS NOTE  PCP: Lavetta Nielsen, MD  HPI/Subjective: Patient alerts and answering questions. Audible wheeze heard. Some cough but whitish phlegm.  Objective: Filed Vitals:   09/05/15 1100  BP: 131/72  Pulse: 86  Temp:   Resp:     Filed Weights   09/03/15 0500 09/04/15 0500 09/05/15 0458  Weight: 72.8 kg (160 lb 7.9 oz) 81.3 kg (179 lb 3.7 oz) 79.9 kg (176 lb 2.4 oz)    ROS: Review of Systems  Constitutional: Negative for fever and chills.  Eyes: Negative for blurred vision.  Respiratory: Positive for cough and wheezing. Negative for shortness of breath.   Cardiovascular: Negative for chest pain.  Gastrointestinal: Negative for nausea, vomiting, abdominal pain, diarrhea and constipation.  Genitourinary: Negative for dysuria.  Musculoskeletal: Negative for joint pain.  Neurological: Negative for dizziness and headaches.   Exam: Physical Exam  HENT:  Nose: No mucosal edema.  Mouth/Throat: No oropharyngeal exudate or posterior oropharyngeal edema.  Eyes: Conjunctivae, EOM and lids are normal. Pupils are equal, round, and reactive to light.  Neck: No JVD present. Carotid bruit is not present. No edema present. No thyroid mass and no thyromegaly present.  Cardiovascular: S1 normal and S2 normal.  Exam reveals no gallop.   No murmur heard. Pulses:      Dorsalis pedis pulses are 2+ on the right side, and 2+ on the left side.  Respiratory: No respiratory distress. She has wheezes in the right middle field, the right lower field, the left middle field and the left lower field. She has no rhonchi. She has no rales.  The wheezing seems transmitted from upper airway.  GI: Soft. Bowel sounds are normal. There is no tenderness.  Musculoskeletal:       Right ankle: She exhibits swelling.       Left ankle: She exhibits swelling.  Lymphadenopathy:    She has no  cervical adenopathy.  Skin: Skin is warm. No rash noted. Nails show no clubbing.  Blackened fifth toe left foot. Blackened eschar left Achilles area.  Psychiatric: She has a normal mood and affect.    Data Reviewed: Basic Metabolic Panel:  Recent Labs Lab 09/01/15 2341 09/02/15 0612 09/03/15 0428 09/04/15 0346 09/05/15 0400  NA 129* 129* 130* 128* 132*  K 4.8 5.1 4.4 4.5 3.9  CL 91* 97* 98* 97* 101  CO2 23 21* 24 22 21*  GLUCOSE 260* 208* 126* 164* 149*  BUN 48* 47* 47* 47* 46*  CREATININE 3.16* 2.93* 2.78* 2.74* 1.84*  CALCIUM 8.3* 7.7* 7.8* 7.7* 8.0*   Liver Function Tests:  Recent Labs Lab 09/01/15 2341  AST 28  ALT 16  ALKPHOS 78  BILITOT 0.8  PROT 6.4*  ALBUMIN 3.6    Recent Labs Lab 09/01/15 2341  LIPASE 41   CBC:  Recent Labs Lab 09/01/15 2341 09/02/15 0612 09/03/15 0428 09/04/15 0346  WBC 29.0* 28.7* 29.1* 21.9*  NEUTROABS 22.0*  --   --   --   HGB 9.7* 9.0* 8.7* 8.7*  HCT 30.0* 27.7* 26.5* 26.9*  MCV 93.5 92.9 93.9 93.8  PLT 312 286 304 324   Cardiac Enzymes:  Recent Labs Lab 09/01/15 2341 09/02/15 0612 09/02/15 1347 09/02/15 1830 09/03/15 0114  TROPONINI 0.05* 0.05* 0.06* 0.06* 0.06*    CBG:  Recent Labs Lab 09/04/15 2006 09/05/15 0005 09/05/15 0410 09/05/15 0803 09/05/15 1144  GLUCAP 272* 166* 149*  160* 214*    Recent Results (from the past 240 hour(s))  Urine culture     Status: None   Collection Time: 09/01/15 11:41 PM  Result Value Ref Range Status   Specimen Description URINE, RANDOM  Final   Special Requests NONE  Final   Culture NO GROWTH 2 DAYS  Final   Report Status 09/03/2015 FINAL  Final  Culture, blood (routine x 2)     Status: None (Preliminary result)   Collection Time: 09/02/15  1:28 AM  Result Value Ref Range Status   Specimen Description BLOOD LEFT ASSIST CONTROL  Final   Special Requests BOTTLES DRAWN AEROBIC AND ANAEROBIC 4CC  Final   Culture NO GROWTH 3 DAYS  Final   Report Status PENDING   Incomplete  Culture, blood (routine x 2)     Status: None (Preliminary result)   Collection Time: 09/02/15  1:28 AM  Result Value Ref Range Status   Specimen Description BLOOD RIGHT ASSIST CONTROL  Final   Special Requests BOTTLES DRAWN AEROBIC AND ANAEROBIC 4CC  Final   Culture NO GROWTH 3 DAYS  Final   Report Status PENDING  Incomplete  Stool culture     Status: None   Collection Time: 09/02/15  9:10 AM  Result Value Ref Range Status   Specimen Description STOOL  Final   Special Requests NONE  Final   Culture   Final    NO CAMPYLOBACTER DETECTED NO SALMONELLA OR SHIGELLA ISOLATED No Pathogenic E. coli detected    Report Status 09/04/2015 FINAL  Final  MRSA PCR Screening     Status: None   Collection Time: 09/02/15  9:10 AM  Result Value Ref Range Status   MRSA by PCR NEGATIVE NEGATIVE Final    Comment:        The GeneXpert MRSA Assay (FDA approved for NASAL specimens only), is one component of a comprehensive MRSA colonization surveillance program. It is not intended to diagnose MRSA infection nor to guide or monitor treatment for MRSA infections.   C difficile quick scan w PCR reflex     Status: None   Collection Time: 09/02/15  9:10 AM  Result Value Ref Range Status   C Diff antigen NEGATIVE NEGATIVE Final   C Diff toxin NEGATIVE NEGATIVE Final   C Diff interpretation Negative for C. difficile  Final     Studies: US Renal  09/04/2015   CLINICAL DATA:  Acute renal failure.  EXAM: RENAL / URINARY TRACT ULTRASOUND COMPLETE  COMPARISON:  CT abdomen and pelvis 09/02/2015.  FINDINGS: Right Kidney:  Length: 9.7 cm. Echogenicity within normal limits. No mass or hydronephrosis visualized.  Left Kidney:  Length: 11.0 cm. Echogenicity within normal limits. No mass or hydronephrosis visualized.  Bladder:  Appears normal for degree of bladder distention.  IMPRESSION: Negative for hydronephrosis.  Negative exam.   Electronically Signed   By: Drusilla Kanner M.D.   On: 09/04/2015  13:07    Scheduled Meds: . aspirin EC  81 mg Oral Daily  . budesonide (PULMICORT) nebulizer solution  0.25 mg Nebulization BID  . clopidogrel  75 mg Oral Daily  . heparin  5,000 Units Subcutaneous 3 times per day  . insulin aspart  0-9 Units Subcutaneous 6 times per day  . pantoprazole  40 mg Oral Daily  . piperacillin-tazobactam (ZOSYN)  IV  3.375 g Intravenous Q12H  . sodium chloride  3 mL Intravenous Q12H  . Vitamin D (Ergocalciferol)  50,000 Units Oral Weekly   Continuous  Infusions: . sodium chloride 50 mL/hr at 09/05/15 0700  . norepinephrine Stopped (09/05/15 1200)    Assessment/Plan:  1. Septic shock, pneumonia. The nursing staff just turned off the Levophed levophed. I will continue gentle IV fluid hydration. Continue IV Zosyn. I will DC vancomycin since cultures are negative 2. Acute renal failure on chronic kidney disease stage III- continue gentle IV fluid hydration. Creatinine is down to 1.84 today. 3. Peripheral vascular disease, with gangrenous left toe, blackened eschar on left Achilles area- on aspirin and Plavix 4. Diabetes mellitus type 2- patient on sliding scale 5. Anemia- watch hemoglobin closely with IV fluid hydration 6. History of coronary artery disease 7. Hyponatremia- likely with dehydration continue normal saline. 8. Elevated troponin- demand ischemia from septic shock. 9. Bigeminy and trigeminy- likely induced with pressors.  Code Status:     Code Status Orders        Start     Ordered   Sep 13, 2015 [phone]  Do not attempt resuscitation (DNR)   Continuous    Question Answer Comment  In the event of cardiac or respiratory ARREST Do not call a "code blue"   In the event of cardiac or respiratory ARREST Do not perform Intubation, CPR, defibrillation or ACLS   In the event of cardiac or respiratory ARREST Use medication by any route, position, wound care, and other measures to relive pain and suffering. May use oxygen, suction and manual treatment of  airway obstruction as needed for comfort.   Comments RN may pronounce death      09-13-15 0611     Family Communication: Family at bedside Disposition Plan: To be determined  Consultants:  Gastroenterology  Antibiotics:  IV Zosyn  Time spent: 20 minutes.  Alford Highland  [facility]

## 2015-09-05 NOTE — Progress Notes (Signed)
[facility]  ROUNDING NOTE   Subjective:  Patient seen at bedside. Creatinine down to 1.8. Urine output was found to be 1.4 L over the past 24 hours. Sodium up to 132. Patient awake and alert.  Objective:  Vital signs in last 24 hours:  Temp:  [97.4 F (36.3 C)-98 F (36.7 C)] 97.9 F (36.6 C) (09/20 0737) Pulse Rate:  [35-88] 83 (09/20 0737) Resp:  [16-25] 21 (09/20 0737) BP: (86-159)/(48-119) 110/48 mmHg (09/20 0737) SpO2:  [80 %-100 %] 94 % (09/20 0737) Weight:  [79.9 kg (176 lb 2.4 oz)] 79.9 kg (176 lb 2.4 oz) (09/20 0458)  Weight change: -1.4 kg (-3 lb 1.4 oz) Filed Weights   09/03/15 0500 09/04/15 0500 09/05/15 0458  Weight: 72.8 kg (160 lb 7.9 oz) 81.3 kg (179 lb 3.7 oz) 79.9 kg (176 lb 2.4 oz)    Intake/Output: I/O last 3 completed shifts: In: 2810.7 [P.O.:60; I.V.:2600.7; IV Piggyback:150] Out: 0630 [Urine:1445]   Intake/Output this shift:     Physical Exam: General: Elderly female, NAD  Head: Normocephalic, atraumatic. Moist oral mucosal membranes  Eyes: Anicteric, eyes open  Neck: Supple, trachea midline  Lungs:  Clear to auscultation normal effort  Heart: S1S2 no rubs  Abdomen:  Soft, nontender, BS present   Extremities: no peripheral edema.  Neurologic: Nonfocal, moving all four extremities  Skin: No lesions       Basic Metabolic Panel:  Recent Labs Lab 09/01/15 2341 09/02/15 0612 09/03/15 0428 09/04/15 0346 09/05/15 0400  NA 129* 129* 130* 128* 132*  K 4.8 5.1 4.4 4.5 3.9  CL 91* 97* 98* 97* 101  CO2 23 21* 24 22 21*  GLUCOSE 260* 208* 126* 164* 149*  BUN 48* 47* 47* 47* 46*  CREATININE 3.16* 2.93* 2.78* 2.74* 1.84*  CALCIUM 8.3* 7.7* 7.8* 7.7* 8.0*    Liver Function Tests:  Recent Labs Lab 09/01/15 2341  AST 28  ALT 16  ALKPHOS 78  BILITOT 0.8  PROT 6.4*  ALBUMIN 3.6    Recent Labs Lab 09/01/15 2341  LIPASE 41   No results for input(s): AMMONIA in the last 168 hours.  CBC:  Recent Labs Lab  09/01/15 2341 09/02/15 0612 09/03/15 0428 09/04/15 0346  WBC 29.0* 28.7* 29.1* 21.9*  NEUTROABS 22.0*  --   --   --   HGB 9.7* 9.0* 8.7* 8.7*  HCT 30.0* 27.7* 26.5* 26.9*  MCV 93.5 92.9 93.9 93.8  PLT 312 286 304 324    Cardiac Enzymes:  Recent Labs Lab 09/01/15 2341 09/02/15 0612 09/02/15 1347 09/02/15 1830 09/03/15 0114  TROPONINI 0.05* 0.05* 0.06* 0.06* 0.06*    BNP: Invalid input(s): POCBNP  CBG:  Recent Labs Lab 09/04/15 1602 09/04/15 2006 09/05/15 0005 09/05/15 0410 09/05/15 0803  GLUCAP 234* 272* 166* 149* 160*    Microbiology: Results for orders placed or performed during the hospital encounter of 09/01/15  Urine culture     Status: None   Collection Time: 09/01/15 11:41 PM  Result Value Ref Range Status   Specimen Description URINE, RANDOM  Final   Special Requests NONE  Final   Culture NO GROWTH 2 DAYS  Final   Report Status 09/03/2015 FINAL  Final  Culture, blood (routine x 2)     Status: None (Preliminary result)   Collection Time: 09/02/15  1:28 AM  Result Value Ref Range Status   Specimen Description BLOOD LEFT ASSIST CONTROL  Final   Special Requests BOTTLES DRAWN AEROBIC AND ANAEROBIC 4CC  Final  Culture NO GROWTH 3 DAYS  Final   Report Status PENDING  Incomplete  Culture, blood (routine x 2)     Status: None (Preliminary result)   Collection Time: 09/02/15  1:28 AM  Result Value Ref Range Status   Specimen Description BLOOD RIGHT ASSIST CONTROL  Final   Special Requests BOTTLES DRAWN AEROBIC AND ANAEROBIC 4CC  Final   Culture NO GROWTH 3 DAYS  Final   Report Status PENDING  Incomplete  Stool culture     Status: None   Collection Time: 09/02/15  9:10 AM  Result Value Ref Range Status   Specimen Description STOOL  Final   Special Requests NONE  Final   Culture   Final    NO CAMPYLOBACTER DETECTED NO SALMONELLA OR SHIGELLA ISOLATED No Pathogenic E. coli detected    Report Status 09/04/2015 FINAL  Final  MRSA PCR Screening      Status: None   Collection Time: 09/02/15  9:10 AM  Result Value Ref Range Status   MRSA by PCR NEGATIVE NEGATIVE Final    Comment:        The GeneXpert MRSA Assay (FDA approved for NASAL specimens only), is one component of a comprehensive MRSA colonization surveillance program. It is not intended to diagnose MRSA infection nor to guide or monitor treatment for MRSA infections.   C difficile quick scan w PCR reflex     Status: None   Collection Time: 09/02/15  9:10 AM  Result Value Ref Range Status   C Diff antigen NEGATIVE NEGATIVE Final   C Diff toxin NEGATIVE NEGATIVE Final   C Diff interpretation Negative for C. difficile  Final    Coagulation Studies: No results for input(s): LABPROT, INR in the last 72 hours.  Urinalysis: No results for input(s): COLORURINE, LABSPEC, PHURINE, GLUCOSEU, HGBUR, BILIRUBINUR, KETONESUR, PROTEINUR, UROBILINOGEN, NITRITE, LEUKOCYTESUR in the last 72 hours.  Invalid input(s): APPERANCEUR    Imaging: US Renal  09/04/2015   CLINICAL DATA:  Acute renal failure.  EXAM: RENAL / URINARY TRACT ULTRASOUND COMPLETE  COMPARISON:  CT abdomen and pelvis 09/02/2015.  FINDINGS: Right Kidney:  Length: 9.7 cm. Echogenicity within normal limits. No mass or hydronephrosis visualized.  Left Kidney:  Length: 11.0 cm. Echogenicity within normal limits. No mass or hydronephrosis visualized.  Bladder:  Appears normal for degree of bladder distention.  IMPRESSION: Negative for hydronephrosis.  Negative exam.   Electronically Signed   By: Inge Rise M.D.   On: 09/04/2015 13:07     Medications:   . sodium chloride 50 mL/hr at 09/05/15 0700  . norepinephrine 2 mcg/min (09/05/15 0700)   . aspirin EC  81 mg Oral Daily  . clopidogrel  75 mg Oral Daily  . heparin  5,000 Units Subcutaneous 3 times per day  . insulin aspart  0-9 Units Subcutaneous 6 times per day  . mometasone-formoterol  2 puff Inhalation BID  . pantoprazole  40 mg Oral Daily  .  piperacillin-tazobactam (ZOSYN)  IV  3.375 g Intravenous Q12H  . sodium chloride  3 mL Intravenous Q12H  . vancomycin  750 mg Intravenous Q36H  . Vitamin D (Ergocalciferol)  50,000 Units Oral Weekly   acetaminophen **OR** acetaminophen, albuterol, ondansetron **OR** ondansetron (ZOFRAN) IV, oxyCODONE-acetaminophen  Assessment/ Plan:  81 y.o. female  with a PMHX of diabetes type 2, chronic kidney disease, hypertension, peripheral vascular disease status post femoropopliteal bypass, dementia, COPD, anemia, was admitted on 09/01/2015 with altered mental status.   1. ARF, due to ATN/CKD  stage III baseline Cr 1.01/egfr 54/proteinuria. Home medication list includes quinapril, amlodipine which could certainly cause ATN in the setting of hypotension and poor by mouth intake.  - Renal ultrasound was found to be normal. Creatinine down to 1.8. Urine output good over the past 24 hours. No indication for dialysis. Continue supportive care with gentle hydration with 0.9 normal saline.  2.  Hypotension:  Remains on norepinephrine drip which we will would continue to maintain a map of 65 or greater.  3.  Hyponatremia: Sodium improved to 132 this a.m. Continue 0.9 normal saline drip.   LOS: 3 [name] 9/20/20169:00 AM

## 2015-09-05 NOTE — Progress Notes (Signed)
[facility]  Daily Progress Note   Subjective  - * No surgery found *  Feeling better. No major events Confused still  Objective Filed Vitals:   09/05/15 1200 09/05/15 1300 09/05/15 1400 09/05/15 1500  BP: 134/67 107/66 150/67 162/65  Pulse: 85 87 88 85  Temp:      TempSrc:      Resp:    19  Height:      Weight:      SpO2: 93% 95% 95% 95%    Intake/Output Summary (Last 24 hours) at 09/05/15 1621 Last data filed at 09/05/15 1500  Gross per 24 hour  Intake 1758.49 ml  Output   1400 ml  Net 358.49 ml    PULM  Diminished, but less labored in her respirations today CV  RRR VASC  Foot wounds stable.  Left pedal pulse is weakly palpable today now that she is off of pressors  Laboratory CBC    Component Value Date/Time   WBC 21.9* 09/04/2015 0346   HGB 8.7* 09/04/2015 0346   HCT 26.9* 09/04/2015 0346   PLT 324 09/04/2015 0346    BMET    Component Value Date/Time   NA 132* 09/05/2015 0400   K 3.9 09/05/2015 0400   CL 101 09/05/2015 0400   CO2 21* 09/05/2015 0400   GLUCOSE 149* 09/05/2015 0400   BUN 46* 09/05/2015 0400   CREATININE 1.84* 09/05/2015 0400   CALCIUM 8.0* 09/05/2015 0400   GFRNONAA 23* 09/05/2015 0400   GFRAA 26* 09/05/2015 0400    Assessment/Planning:    Sepsis with pneumonia.  Seems to be improving.  Off of pressors.  PAD with ulceration and gangrene left foot.  Stable.  Wounds appear unchanged today without any signs of active infection  ARF. Improving     DEW,JASON  09/05/2015, 4:21 PM

## 2015-09-06 LAB — UIFE/LIGHT CHAINS/TP QN, 24-HR UR
% BETA, Urine: 24.1 %
ALPHA 1 URINE: 1.7 %
ALPHA 2 UR: 18.2 %
Albumin, U: 44.5 %
FREE KAPPA/LAMBDA RATIO: 4.13 (ref 2.04–10.37)
FREE LT CHN EXCR RATE: 80.5 mg/L — AB (ref 1.35–24.19)
Free Lambda Lt Chains,Ur: 19.5 mg/L — ABNORMAL HIGH (ref 0.24–6.66)
GAMMA GLOBULIN URINE: 11.5 %
TOTAL PROTEIN, URINE-UPE24: 15.1 mg/dL
TOTAL PROTEIN, URINE-UR/DAY: 211.4 mg/(24.h) — AB (ref 30.0–150.0)

## 2015-09-06 LAB — BASIC METABOLIC PANEL
ANION GAP: 11 (ref 5–15)
BUN: 29 mg/dL — AB (ref 6–20)
CALCIUM: 7.4 mg/dL — AB (ref 8.9–10.3)
CO2: 21 mmol/L — ABNORMAL LOW (ref 22–32)
Chloride: 106 mmol/L (ref 101–111)
Creatinine, Ser: 1.08 mg/dL — ABNORMAL HIGH (ref 0.44–1.00)
GFR calc Af Amer: 50 mL/min — ABNORMAL LOW (ref >60)
GFR, EST NON AFRICAN AMERICAN: 43 mL/min — AB (ref >60)
GLUCOSE: 107 mg/dL — AB (ref 65–99)
POTASSIUM: 3.5 mmol/L (ref 3.5–5.1)
SODIUM: 138 mmol/L (ref 135–145)

## 2015-09-06 LAB — CBC
HCT: 24.4 % — ABNORMAL LOW (ref 35.0–47.0)
Hemoglobin: 8 g/dL — ABNORMAL LOW (ref 12.0–16.0)
MCH: 31.1 pg (ref 26.0–34.0)
MCHC: 33 g/dL (ref 32.0–36.0)
MCV: 94.1 fL (ref 80.0–100.0)
PLATELETS: 251 10*3/uL (ref 150–440)
RBC: 2.59 MIL/uL — ABNORMAL LOW (ref 3.80–5.20)
RDW: 13.5 % (ref 11.5–14.5)
WBC: 8.3 10*3/uL (ref 3.6–11.0)

## 2015-09-06 LAB — ANTINUCLEAR ANTIBODIES, IFA: ANTINUCLEAR ANTIBODIES, IFA: NEGATIVE

## 2015-09-06 LAB — GLUCOSE, CAPILLARY
GLUCOSE-CAPILLARY: 122 mg/dL — AB (ref 65–99)
GLUCOSE-CAPILLARY: 124 mg/dL — AB (ref 65–99)
GLUCOSE-CAPILLARY: 139 mg/dL — AB (ref 65–99)
GLUCOSE-CAPILLARY: 180 mg/dL — AB (ref 65–99)
GLUCOSE-CAPILLARY: 98 mg/dL (ref 65–99)
Glucose-Capillary: 177 mg/dL — ABNORMAL HIGH (ref 65–99)

## 2015-09-06 MED ORDER — PIPERACILLIN-TAZOBACTAM 3.375 G IVPB
3.3750 g | Freq: Three times a day (TID) | INTRAVENOUS | Status: DC
  Administered 2015-09-06 – 2015-09-08 (×6): 3.375 g via INTRAVENOUS
  Filled 2015-09-06 (×10): qty 50

## 2015-09-06 NOTE — Progress Notes (Signed)
Patient ID: [name], female   DOB: 1921-08-17, 81 y.o.   MRN: [id number]  [facility]ospital Phys[facility] PROGRESS NOTE  PCP: Lavetta Nielsen, MD  HPI/Subjective: Patient is alert and answering questions, but confused and. Continues to have some wheezing for which she is given Pulmicort. Blood pressure has improved significantly as well as her kidney function. Remains on IV fluids. Being transferred to regular medical floor  Objective: Filed Vitals:   09/06/15 1052  BP: 128/70  Pulse: 106  Temp:   Resp:     Filed Weights   09/04/15 0500 09/05/15 0458 09/06/15 0500  Weight: 81.3 kg (179 lb 3.7 oz) 79.9 kg (176 lb 2.4 oz) 75.9 kg (167 lb 5.3 oz)    ROS: Review of Systems  Constitutional: Negative for fever and chills.  Eyes: Negative for blurred vision.  Respiratory: Positive for cough and wheezing. Negative for shortness of breath.   Cardiovascular: Negative for chest pain.  Gastrointestinal: Negative for nausea, vomiting, abdominal pain, diarrhea and constipation.  Genitourinary: Negative for dysuria.  Musculoskeletal: Negative for joint pain.  Neurological: Negative for dizziness and headaches.   Exam: Physical Exam  HENT:  Nose: No mucosal edema.  Mouth/Throat: No oropharyngeal exudate or posterior oropharyngeal edema.  Eyes: Conjunctivae, EOM and lids are normal. Pupils are equal, round, and reactive to light.  Neck: No JVD present. Carotid bruit is not present. No edema present. No thyroid mass and no thyromegaly present.  Cardiovascular: S1 normal and S2 normal.  Exam reveals no gallop.   No murmur heard. Pulses:      Dorsalis pedis pulses are 2+ on the right side, and 2+ on the left side.  Respiratory: No respiratory distress. She has wheezes in the right middle field, the right lower field, the left middle field and the left lower field. She has no rhonchi. She has no rales.  The wheezing seems transmitted from upper airway.  GI: Soft. Bowel sounds are  normal. There is no tenderness.  Musculoskeletal:       Right ankle: She exhibits swelling.       Left ankle: She exhibits swelling.  Lymphadenopathy:    She has no cervical adenopathy.  Skin: Skin is warm. No rash noted. Nails show no clubbing.  Blackened fifth toe left foot. Blackened eschar left Achilles area.  Psychiatric: She has a normal mood and affect.    Data Reviewed: Basic Metabolic Panel:  Recent Labs Lab 09/02/15 0612 09/03/15 0428 09/04/15 0346 09/05/15 0400 09/06/15 0512  NA 129* 130* 128* 132* 138  K 5.1 4.4 4.5 3.9 3.5  CL 97* 98* 97* 101 106  CO2 21* 24 22 21* 21*  GLUCOSE 208* 126* 164* 149* 107*  BUN 47* 47* 47* 46* 29*  CREATININE 2.93* 2.78* 2.74* 1.84* 1.08*  CALCIUM 7.7* 7.8* 7.7* 8.0* 7.4*   Liver Function Tests:  Recent Labs Lab 09/01/15 2341  AST 28  ALT 16  ALKPHOS 78  BILITOT 0.8  PROT 6.4*  ALBUMIN 3.6    Recent Labs Lab 09/01/15 2341  LIPASE 41   CBC:  Recent Labs Lab 09/01/15 2341 09/02/15 0612 09/03/15 0428 09/04/15 0346 09/06/15 0512  WBC 29.0* 28.7* 29.1* 21.9* 8.3  NEUTROABS 22.0*  --   --   --   --   HGB 9.7* 9.0* 8.7* 8.7* 8.0*  HCT 30.0* 27.7* 26.5* 26.9* 24.4*  MCV 93.5 92.9 93.9 93.8 94.1  PLT 312 286 304 324 251   Cardiac Enzymes:  Recent Labs Lab 09/01/15 2341  09/02/15 0612 09/02/15 1347 09/02/15 1830 09/03/15 0114  TROPONINI 0.05* 0.05* 0.06* 0.06* 0.06*    CBG:  Recent Labs Lab 09/05/15 2002 09/06/15 0016 09/06/15 0424 09/06/15 0713 09/06/15 1139  GLUCAP 161* 122* 98 124* 177*    Recent Results (from the past 240 hour(s))  Urine culture     Status: None   Collection Time: 09/01/15 11:41 PM  Result Value Ref Range Status   Specimen Description URINE, RANDOM  Final   Special Requests NONE  Final   Culture NO GROWTH 2 DAYS  Final   Report Status 09/03/2015 FINAL  Final  Culture, blood (routine x 2)     Status: None (Preliminary result)   Collection Time: 09/02/15  1:28 AM   Result Value Ref Range Status   Specimen Description BLOOD LEFT ASSIST CONTROL  Final   Special Requests BOTTLES DRAWN AEROBIC AND ANAEROBIC 4CC  Final   Culture NO GROWTH 3 DAYS  Final   Report Status PENDING  Incomplete  Culture, blood (routine x 2)     Status: None (Preliminary result)   Collection Time: 09/02/15  1:28 AM  Result Value Ref Range Status   Specimen Description BLOOD RIGHT ASSIST CONTROL  Final   Special Requests BOTTLES DRAWN AEROBIC AND ANAEROBIC 4CC  Final   Culture NO GROWTH 3 DAYS  Final   Report Status PENDING  Incomplete  Stool culture     Status: None   Collection Time: 09/02/15  9:10 AM  Result Value Ref Range Status   Specimen Description STOOL  Final   Special Requests NONE  Final   Culture   Final    NO CAMPYLOBACTER DETECTED NO SALMONELLA OR SHIGELLA ISOLATED No Pathogenic E. coli detected    Report Status 09/04/2015 FINAL  Final  MRSA PCR Screening     Status: None   Collection Time: 09/02/15  9:10 AM  Result Value Ref Range Status   MRSA by PCR NEGATIVE NEGATIVE Final    Comment:        The GeneXpert MRSA Assay (FDA approved for NASAL specimens only), is one component of a comprehensive MRSA colonization surveillance program. It is not intended to diagnose MRSA infection nor to guide or monitor treatment for MRSA infections.   C difficile quick scan w PCR reflex     Status: None   Collection Time: 09/02/15  9:10 AM  Result Value Ref Range Status   C Diff antigen NEGATIVE NEGATIVE Final   C Diff toxin NEGATIVE NEGATIVE Final   C Diff interpretation Negative for C. difficile  Final     Studies: No results found.  Scheduled Meds: . aspirin EC  81 mg Oral Daily  . budesonide (PULMICORT) nebulizer solution  0.25 mg Nebulization BID  . clopidogrel  75 mg Oral Daily  . heparin  5,000 Units Subcutaneous 3 times per day  . insulin aspart  0-9 Units Subcutaneous 6 times per day  . pantoprazole  40 mg Oral Daily  . piperacillin-tazobactam  (ZOSYN)  IV  3.375 g Intravenous 3 times per day  . sodium chloride  3 mL Intravenous Q12H  . Vitamin D (Ergocalciferol)  50,000 Units Oral Weekly   Continuous Infusions: . sodium chloride 50 mL/hr at 09/06/15 1119  . norepinephrine Stopped (09/05/15 1200)    Assessment/Plan:  1. Septic shock, pneumonia. Resolved now, off levophed. Continue IV fluid hydration. Continue IV Zosyn. Off vancomycin since cultures are negative 2. Acute renal failure on chronic kidney disease stage III- continue gentle  IV fluid hydration. Creatinine is down to 1.08 today. Appreciate nephrologist and put 3. Peripheral vascular disease, with gangrenous left toe, blackened eschar on left Achilles area- on aspirin and Plavix, no intervention at present. No infection. She is being seen by Dr. Wyn Quaker, appreciate input 4. Diabetes mellitus type 2- patient on sliding scale 5. Anemia- watch hemoglobin closely with IV fluid hydration 6. History of coronary artery disease 7. Hyponatremia- likely due to dehydration , improved now, continue normal saline. 8. Elevated troponin- demand ischemia from septic shock. 9. Bigeminy and trigeminy- likely induced with pressors, remains tachycardic despite being of levophed  Code Status:     Code Status Orders        Start     Ordered   09/06/2015 0611  Do not attempt resuscitation (DNR)   Continuous    Question Answer Comment  In the event of cardiac or respiratory ARREST Do not call a "code blue"   In the event of cardiac or respiratory ARREST Do not perform Intubation, CPR, defibrillation or ACLS   In the event of cardiac or respiratory ARREST Use medication by any route, position, wound care, and other measures to relive pain and suffering. May use oxygen, suction and manual treatment of airway obstruction as needed for comfort.   Comments RN may pronounce death      09/06/15 0611     Family Communication: Family at bedside Disposition Plan: To be determined Discussed  extensively with patient's daughter, all questions were answered, voiced understanding Consultants:  Gastroenterology  Antibiotics:  IV Zosyn  Time spent: 40 minutes.  [facility]  [facility] [place]

## 2015-09-06 NOTE — Progress Notes (Signed)
[name] and [name]  Daily Progress Note   Subjective  - * No Hensley found *  No major events. Sleeping, worked well with PT today  Objective Filed Vitals:   09/06/15 1000 09/06/15 1052 09/06/15 1100 09/06/15 1200  BP: 128/70 128/70 106/68   Pulse: 105 106    Temp:    98.3 F (36.8 C)  TempSrc:    Axillary  Resp: 18  21   Height:      Weight:      SpO2: 100% 100%      Intake/Output Summary (Last 24 hours) at 09/06/15 1634 Last data filed at 09/06/15 1000  Gross per 24 hour  Intake   1090 ml  Output    400 ml  Net    690 ml    PULM  CTAB CV  RRR VASC  Palpable pedal pulse on left, wounds stable  Laboratory CBC    Component Value Date/Time   WBC 8.3 09/06/2015 0512   HGB 8.0* 09/06/2015 0512   HCT 24.4* 09/06/2015 0512   PLT 251 09/06/2015 0512    BMET    Component Value Date/Time   NA 138 09/06/2015 0512   K 3.5 09/06/2015 0512   CL 106 09/06/2015 0512   CO2 21* 09/06/2015 0512   GLUCOSE 107* 09/06/2015 0512   BUN 29* 09/06/2015 0512   CREATININE 1.08* 09/06/2015 0512   CALCIUM 7.4* 09/06/2015 0512   GFRNONAA 43* 09/06/2015 0512   GFRAA 50* 09/06/2015 0512    Assessment/Planning:    Sepsis from pneumonia, improving  ARF improving, back to near baseline it appears  Left foot stable, pulses present.  Wounds and gangrenous changes stable.  S/p intervention for PAD previously  Heel protectors.   No other recs from Michelle POV    [name],[name]  09/06/2015, 4:34 PM

## 2015-09-06 NOTE — Care Management Important Message (Signed)
Important Message  Patient Details  Name: [name] MRN: [id number] Date of Birth: 29-Sep-1921   Medicare Important Message Given:  Yes-third notification given    Verita Schneiders Allmond 09/06/2015, 2:38 PM

## 2015-09-06 NOTE — Care Management Note (Signed)
Case Management Note  Patient Details  Name: [name] MRN: [id number] Date of Birth: February 26, 1921  Subjective/Objective:   PNA improving. Off pressors.  Plan is transfer to telemetry.                 Action/Plan: PACE  Expected Discharge Date:                  Expected Discharge Plan:  Home w Home Health Services  In-House Referral:     Discharge planning Services  CM Consult  Post Acute Care Choice:   PACE Choice offered to:     DME Arranged:    DME Agency:     HH Arranged:   PACE [facility] Agency:     Status of Service:  In process, will continue to follow  Medicare Important Message Given:  Yes-second notification given Date Medicare IM Given:    Medicare IM give by:    Date Additional Medicare IM Given:    Additional Medicare Important Message give by:     If discussed at Long Length of Stay Meetings, dates discussed:    Additional Comments:  Marily Memos, RN 09/06/2015, 9:17 AM

## 2015-09-06 NOTE — Progress Notes (Signed)
[facility]  ROUNDING NOTE   Subjective:  Patient doing better today. Cr down to 1.08. Good UOP noted.   Objective:  Vital signs in last 24 hours:  Temp:  [98.2 F (36.8 C)-98.6 F (37 C)] 98.2 F (36.8 C) (09/21 0800) Pulse Rate:  [26-106] 106 (09/21 1052) Resp:  [14-22] 18 (09/21 1000) BP: (107-168)/(57-112) 128/70 mmHg (09/21 1052) SpO2:  [88 %-100 %] 100 % (09/21 1052) Weight:  [75.9 kg (167 lb 5.3 oz)] 75.9 kg (167 lb 5.3 oz) (09/21 0500)  Weight change: -4 kg (-8 lb 13.1 oz) Filed Weights   09/04/15 0500 09/05/15 0458 09/06/15 0500  Weight: 81.3 kg (179 lb 3.7 oz) 79.9 kg (176 lb 2.4 oz) 75.9 kg (167 lb 5.3 oz)    Intake/Output: I/O last 3 completed shifts: In: 2357.1 [I.V.:2057.1; IV Piggyback:300] Out: 1450 [Urine:1450]   Intake/Output this shift:  Total I/O In: 290 [P.O.:140; I.V.:150] Out: -   Physical Exam: General: Elderly female, NAD  Head: Normocephalic, atraumatic. Moist oral mucosal membranes  Eyes: Anicteric, eyes open  Neck: Supple, trachea midline  Lungs:  Clear to auscultation normal effort  Heart: S1S2 no rubs  Abdomen:  Soft, nontender, BS present   Extremities: no peripheral edema.  Neurologic: Nonfocal, moving all four extremities  Skin: No lesions       Basic Metabolic Panel:  Recent Labs Lab 09/02/15 0612 09/03/15 0428 09/04/15 0346 09/05/15 0400 09/06/15 0512  NA 129* 130* 128* 132* 138  K 5.1 4.4 4.5 3.9 3.5  CL 97* 98* 97* 101 106  CO2 21* 24 22 21* 21*  GLUCOSE 208* 126* 164* 149* 107*  BUN 47* 47* 47* 46* 29*  CREATININE 2.93* 2.78* 2.74* 1.84* 1.08*  CALCIUM 7.7* 7.8* 7.7* 8.0* 7.4*    Liver Function Tests:  Recent Labs Lab 09/01/15 2341  AST 28  ALT 16  ALKPHOS 78  BILITOT 0.8  PROT 6.4*  ALBUMIN 3.6    Recent Labs Lab 09/01/15 2341  LIPASE 41   No results for input(s): AMMONIA in the last 168 hours.  CBC:  Recent Labs Lab 09/01/15 2341 09/02/15 0612 09/03/15 0428  09/04/15 0346 09/06/15 0512  WBC 29.0* 28.7* 29.1* 21.9* 8.3  NEUTROABS 22.0*  --   --   --   --   HGB 9.7* 9.0* 8.7* 8.7* 8.0*  HCT 30.0* 27.7* 26.5* 26.9* 24.4*  MCV 93.5 92.9 93.9 93.8 94.1  PLT 312 286 304 324 251    Cardiac Enzymes:  Recent Labs Lab 09/01/15 2341 09/02/15 0612 09/02/15 1347 09/02/15 1830 09/03/15 0114  TROPONINI 0.05* 0.05* 0.06* 0.06* 0.06*    BNP: Invalid input(s): POCBNP  CBG:  Recent Labs Lab 09/05/15 2002 09/06/15 0016 09/06/15 0424 09/06/15 0713 09/06/15 1139  GLUCAP 161* 122* 98 124* 177*    Microbiology: Results for orders placed or performed during the hospital encounter of 09/01/15  Urine culture     Status: None   Collection Time: 09/01/15 11:41 PM  Result Value Ref Range Status   Specimen Description URINE, RANDOM  Final   Special Requests NONE  Final   Culture NO GROWTH 2 DAYS  Final   Report Status 09/03/2015 FINAL  Final  Culture, blood (routine x 2)     Status: None (Preliminary result)   Collection Time: 09/02/15  1:28 AM  Result Value Ref Range Status   Specimen Description BLOOD LEFT ASSIST CONTROL  Final   Special Requests BOTTLES DRAWN AEROBIC AND ANAEROBIC 4CC  Final  Culture NO GROWTH 3 DAYS  Final   Report Status PENDING  Incomplete  Culture, blood (routine x 2)     Status: None (Preliminary result)   Collection Time: 09/02/15  1:28 AM  Result Value Ref Range Status   Specimen Description BLOOD RIGHT ASSIST CONTROL  Final   Special Requests BOTTLES DRAWN AEROBIC AND ANAEROBIC 4CC  Final   Culture NO GROWTH 3 DAYS  Final   Report Status PENDING  Incomplete  Stool culture     Status: None   Collection Time: 09/02/15  9:10 AM  Result Value Ref Range Status   Specimen Description STOOL  Final   Special Requests NONE  Final   Culture   Final    NO CAMPYLOBACTER DETECTED NO SALMONELLA OR SHIGELLA ISOLATED No Pathogenic E. coli detected    Report Status 09/04/2015 FINAL  Final  MRSA PCR Screening      Status: None   Collection Time: 09/02/15  9:10 AM  Result Value Ref Range Status   MRSA by PCR NEGATIVE NEGATIVE Final    Comment:        The GeneXpert MRSA Assay (FDA approved for NASAL specimens only), is one component of a comprehensive MRSA colonization surveillance program. It is not intended to diagnose MRSA infection nor to guide or monitor treatment for MRSA infections.   C difficile quick scan w PCR reflex     Status: None   Collection Time: 09/02/15  9:10 AM  Result Value Ref Range Status   C Diff antigen NEGATIVE NEGATIVE Final   C Diff toxin NEGATIVE NEGATIVE Final   C Diff interpretation Negative for C. difficile  Final    Coagulation Studies: No results for input(s): LABPROT, INR in the last 72 hours.  Urinalysis: No results for input(s): COLORURINE, LABSPEC, PHURINE, GLUCOSEU, HGBUR, BILIRUBINUR, KETONESUR, PROTEINUR, UROBILINOGEN, NITRITE, LEUKOCYTESUR in the last 72 hours.  Invalid input(s): APPERANCEUR    Imaging: No results found.   Medications:   . sodium chloride 50 mL/hr at 09/06/15 1119  . norepinephrine Stopped (09/05/15 1200)   . aspirin EC  81 mg Oral Daily  . budesonide (PULMICORT) nebulizer solution  0.25 mg Nebulization BID  . clopidogrel  75 mg Oral Daily  . heparin  5,000 Units Subcutaneous 3 times per day  . insulin aspart  0-9 Units Subcutaneous 6 times per day  . pantoprazole  40 mg Oral Daily  . piperacillin-tazobactam (ZOSYN)  IV  3.375 g Intravenous 3 times per day  . sodium chloride  3 mL Intravenous Q12H  . Vitamin D (Ergocalciferol)  50,000 Units Oral Weekly   acetaminophen **OR** acetaminophen, albuterol, ondansetron **OR** ondansetron (ZOFRAN) IV, oxyCODONE-acetaminophen  Assessment/ Plan:  81 y.o. female  with a PMHX of diabetes type 2, chronic kidney disease, hypertension, peripheral vascular disease status post femoropopliteal bypass, dementia, COPD, anemia, was admitted on 09/01/2015 with altered mental status.   1.  ARF, due to ATN/CKD stage III baseline Cr 1.01/egfr 54/proteinuria. Home medication list includes quinapril, amlodipine which could certainly cause ATN in the setting of hypotension and poor by mouth intake.  - Cr continues to decline slowly, down to 1.08, BUN 29, good UOP noted, continue supportive care and avoid nephrotoxins as possible.  2.  Hypotension:  Off pressors since yesterday, continue to monitor BP, MAP goal is 65.   3.  Hyponatremia: Sodium up to 138, would continue 0.9 NS for one additional day.   LOS: 4 LATEEF, MUNSOOR 9/21/201611:50 AM

## 2015-09-06 NOTE — Progress Notes (Signed)
ANTIBIOTIC CONSULT NOTE -follow up  Pharmacy Consult for Zosyn Indication: rule out sepsis  No Known Allergies  Patient Measurements: Height:  (165.1 cm) Weight: 167 lb 5.3 oz (75.9 kg) IBW/kg (Calculated) : 57 Adjusted Body Weight: 63.3 kg  Vital Signs: Temp: 98.2 F (36.8 C) (09/21 0800) Temp Source: Oral (09/21 0800) BP: 168/72 mmHg (09/21 0800) Pulse Rate: 86 (09/21 0800)  Labs:  Recent Labs  09/04/15 0346 09/05/15 0400 09/06/15 0512  WBC 21.9*  --  8.3  HGB 8.7*  --  8.0*  PLT 324  --  251  CREATININE 2.74* 1.84* 1.08*   Estimated Creatinine Clearance: 33.9 mL/min (by C-G formula based on Cr of 1.08).   Microbiology: Recent Results (from the past 720 hour(s))  Urine culture     Status: None   Collection Time: 09/01/15 11:41 PM  Result Value Ref Range Status   Specimen Description URINE, RANDOM  Final   Special Requests NONE  Final   Culture NO GROWTH 2 DAYS  Final   Report Status 09/03/2015 FINAL  Final  Culture, blood (routine x 2)     Status: None (Preliminary result)   Collection Time: 09/02/15  1:28 AM  Result Value Ref Range Status   Specimen Description BLOOD LEFT ASSIST CONTROL  Final   Special Requests BOTTLES DRAWN AEROBIC AND ANAEROBIC 4CC  Final   Culture NO GROWTH 3 DAYS  Final   Report Status PENDING  Incomplete  Culture, blood (routine x 2)     Status: None (Preliminary result)   Collection Time: 09/02/15  1:28 AM  Result Value Ref Range Status   Specimen Description BLOOD RIGHT ASSIST CONTROL  Final   Special Requests BOTTLES DRAWN AEROBIC AND ANAEROBIC 4CC  Final   Culture NO GROWTH 3 DAYS  Final   Report Status PENDING  Incomplete  Stool culture     Status: None   Collection Time: 09/02/15  9:10 AM  Result Value Ref Range Status   Specimen Description STOOL  Final   Special Requests NONE  Final   Culture   Final    NO CAMPYLOBACTER DETECTED NO SALMONELLA OR SHIGELLA ISOLATED No Pathogenic E. coli detected    Report Status  09/04/2015 FINAL  Final  MRSA PCR Screening     Status: None   Collection Time: 09/02/15  9:10 AM  Result Value Ref Range Status   MRSA by PCR NEGATIVE NEGATIVE Final    Comment:        The GeneXpert MRSA Assay (FDA approved for NASAL specimens only), is one component of a comprehensive MRSA colonization surveillance program. It is not intended to diagnose MRSA infection nor to guide or monitor treatment for MRSA infections.   C difficile quick scan w PCR reflex     Status: None   Collection Time: 09/02/15  9:10 AM  Result Value Ref Range Status   C Diff antigen NEGATIVE NEGATIVE Final   C Diff toxin NEGATIVE NEGATIVE Final   C Diff interpretation Negative for C. difficile  Final    Medical History: Past Medical History  Diagnosis Date  . Abnormal heart rhythms   . Diabetes mellitus without complication   . Coronary artery disease   . Hyperlipidemia   . Hypertension   . Chronic kidney disease   . Varicose veins   . Peripheral vascular disease   . Anemia   . Arthritis     Medications:  Infusions:  . sodium chloride 50 mL/hr at 09/06/15 0700  .  norepinephrine Stopped (09/05/15 1200)   Assessment: 89 yof cc AMS with hx CKD III. Has not been eating/drinking well for past several days per daughter. LLE stent for PAD in August then developed wound over left heel and dry gangrene of left fifth toe was on oral abx for 21 days which she completed 2 days ago. CXR atelectasis vs PNA.   Plan:  Will increase Zosyn dosing to q 8 hours due to improved renal function. Will continue to monitor renal function and culture results.   Luisa Hart D, Pharm.D. Clinical Pharmacist  09/06/2015,9:41 AM

## 2015-09-06 NOTE — Evaluation (Signed)
Physical Therapy Evaluation Patient Details Name: GRISELLE RUFER MRN: [id number] DOB: 06/06/1921 Today's Date: 09/06/2015   History of Present Illness  Patient is a 81 y/o female that presents with generalized sepsis and acute renal failure. Patient had been ambulatory as of July 2016, however she began to develop a small ulcer on her LLE and was found to have minimal circulation to this extremity. This ulcer has continued to increase in size and now presents with prior mentioned complaints. She has been mostly bed bound aside from stand pivot transfers to and from [facility] and rollator since July.   Clinical Impression  Patient is a 81 y/o female with recent history of decreased blood supply to her LLE and developed an ulceration. Recently she has been able to transfer to and from a rollator as a chair or a BSC, however her daughter has had to provide a significant amount of assistance. Patient is typically able to sit up for several hours at a time, however today she is only able to tolerate several minutes of sitting prior to fatiguing. Patient does provide good effort with supine to sit transfer, requiring mod A x1. Patient would benefit from skilled PT services to increase her sitting endurance and mobility tolerance.     Follow Up Recommendations SNF    Equipment Recommendations       Recommendations for Other Services       Precautions / Restrictions Precautions Precautions: Fall Restrictions Weight Bearing Restrictions: No Other Position/Activity Restrictions: Patient has not been officially given a WB status on LLE, will need a boot or WB status alteration given where her ulceration is and the pain she is experiencing.       Mobility  Bed Mobility Overal bed mobility: Needs Assistance Bed Mobility: Supine to Sit;Sit to Supine     Supine to sit: Mod assist Sit to supine: Mod assist;+2 for physical assistance   General bed mobility comments: Patient is able to use bed rails  minimally and perform necessary LE mobility to prepare for dangling, she is deconditioned and requires assistance for management of torso to and from dangling.   Transfers                 General transfer comment: Deferred   Ambulation/Gait                Stairs            Wheelchair Mobility    Modified Rankin (Stroke Patients Only)       Balance Overall balance assessment: Needs assistance Sitting-balance support: Feet unsupported Sitting balance-Leahy Scale: Fair Sitting balance - Comments: Patient is able to perform MMT with slight posterior loss of balance though she is able to correct.  Postural control: Posterior lean                                   Pertinent Vitals/Pain Pain Assessment:  (Patient complains of pain only initially with mobility, no complaints during MMT. )    Home Living Family/patient expects to be discharged to:: Private residence Living Arrangements: Children Available Help at Discharge: Family Type of Home: House       Home Layout: One level Home Equipment: Environmental consultant - 4 wheels;Bedside commode;Hospital bed      Prior Function Level of Independence: Needs assistance         Comments: Patient has been totally dependent on daughter for all care and  mobility.      Hand Dominance        Extremity/Trunk Assessment   Upper Extremity Assessment: Overall WFL for tasks assessed (Able to perform anti-gravity movements such as hands touching hair bilaterally ))           Lower Extremity Assessment: Overall WFL for tasks assessed (Able to perform anti-gravity LE movements bilaterally (knee extension, hip flexion))      Cervical / Trunk Assessment: Kyphotic  Communication   Communication: HOH (Dementia )  Cognition Arousal/Alertness: Lethargic Behavior During Therapy: WFL for tasks assessed/performed;Flat affect Overall Cognitive Status: History of cognitive impairments - at baseline (Patient with  dementia at baseline, perhaps even more exacerbated now. )                      General Comments General comments (skin integrity, edema, etc.): Ulceration on left Achilles area and on 5th toe.     Exercises        Assessment/Plan    PT Assessment Patient needs continued PT services  PT Diagnosis Difficulty walking;Generalized weakness;Acute pain   PT Problem List Decreased strength;Decreased safety awareness;Decreased mobility;Decreased activity tolerance;Decreased balance;Decreased knowledge of use of DME;Pain;Cardiopulmonary status limiting activity;Decreased skin integrity  PT Treatment Interventions Neuromuscular re-education;Patient/family education;Therapeutic exercise;Therapeutic activities;DME instruction   PT Goals (Current goals can be found in the Care Plan section) Acute Rehab PT Goals Patient Stated Goal: To increase her level of mobility as able  PT Goal Formulation: With patient/family Time For Goal Achievement: 09/20/15 Potential to Achieve Goals: Fair    Frequency Min 2X/week   Barriers to discharge Decreased caregiver support Patient has a daughter that has been caring for her, however the daughter has a recent cervical/thoracic strain and seems exhausted with the level of care required currently.     Co-evaluation               End of Session   Activity Tolerance: Patient limited by fatigue;Patient tolerated treatment well Patient left: in bed;with call bell/phone within reach;with family/visitor present;with nursing/sitter in room Nurse Communication: Mobility status         Time: 1010-1039 PT Time Calculation (min) (ACUTE ONLY): 29 min   Charges:   PT Evaluation $Initial PT Evaluation Tier I: 1 Procedure     PT G Codes:       Kerin Ransom, PT, DPT    09/06/2015, 11:03 AM

## 2015-09-07 LAB — BASIC METABOLIC PANEL
ANION GAP: 8 (ref 5–15)
BUN: 22 mg/dL — AB (ref 6–20)
CHLORIDE: 106 mmol/L (ref 101–111)
CO2: 23 mmol/L (ref 22–32)
Calcium: 8.3 mg/dL — ABNORMAL LOW (ref 8.9–10.3)
Creatinine, Ser: 0.96 mg/dL (ref 0.44–1.00)
GFR calc Af Amer: 58 mL/min — ABNORMAL LOW (ref >60)
GFR, EST NON AFRICAN AMERICAN: 50 mL/min — AB (ref >60)
Glucose, Bld: 112 mg/dL — ABNORMAL HIGH (ref 65–99)
POTASSIUM: 3.9 mmol/L (ref 3.5–5.1)
SODIUM: 137 mmol/L (ref 135–145)

## 2015-09-07 LAB — GLUCOSE, CAPILLARY
GLUCOSE-CAPILLARY: 111 mg/dL — AB (ref 65–99)
GLUCOSE-CAPILLARY: 132 mg/dL — AB (ref 65–99)
Glucose-Capillary: 135 mg/dL — ABNORMAL HIGH (ref 65–99)
Glucose-Capillary: 139 mg/dL — ABNORMAL HIGH (ref 65–99)
Glucose-Capillary: 147 mg/dL — ABNORMAL HIGH (ref 65–99)

## 2015-09-07 LAB — CULTURE, BLOOD (ROUTINE X 2)
CULTURE: NO GROWTH
Culture: NO GROWTH

## 2015-09-07 LAB — HEMOGLOBIN: HEMOGLOBIN: 8.6 g/dL — AB (ref 12.0–16.0)

## 2015-09-07 MED ORDER — ATENOLOL 50 MG PO TABS
50.0000 mg | ORAL_TABLET | Freq: Every day | ORAL | Status: DC
  Administered 2015-09-07 – 2015-09-09 (×3): 50 mg via ORAL
  Filled 2015-09-07 (×3): qty 1

## 2015-09-07 MED ORDER — AMLODIPINE BESYLATE 5 MG PO TABS
5.0000 mg | ORAL_TABLET | Freq: Every day | ORAL | Status: DC
  Administered 2015-09-07 – 2015-09-08 (×2): 5 mg via ORAL
  Filled 2015-09-07 (×2): qty 1

## 2015-09-07 MED ORDER — TRAMADOL HCL 50 MG PO TABS
50.0000 mg | ORAL_TABLET | Freq: Every day | ORAL | Status: DC
  Administered 2015-09-07 – 2015-09-08 (×2): 50 mg via ORAL
  Filled 2015-09-07 (×2): qty 1

## 2015-09-07 NOTE — Clinical Social Work Note (Signed)
Clinical Social Work Assessment  Patient Details  Name: [name] MRN: [id number] Date of Birth: 1921-03-30  Date of referral:  09/07/15               Reason for consult:  Facility Placement                Permission sought to share information with:    Permission granted to share information::     Name::        Agency::   ([facility])  Relationship::     Contact Information:     Housing/Transportation Living arrangements for the past 2 months:  Single Family Home Source of Information:  Adult Children Patient Interpreter Needed:  None Criminal Activity/Legal Involvement Pertinent to Current Situation/Hospitalization:  No - Comment as needed Significant Relationships:  Adult Children, Warehouse manager, Other(Comment) (PACE team) Lives with:    Do you feel safe going back to the place where you live?  No Need for family participation in patient care:  Yes (Comment)  Care giving concerns:   Recommendation is for SNF at dc.   Social Worker assessment / plan:  CSW met with daughter, Meredith Mody, at bedside- patient with dementia and not engaged during visit. CSW discussed recommendation for SNF at dc-  Patient is cared for at home by family and is followed by the PACE team as well. "Mom is never left alone". She has 3 daughters that provide the care at home- Discussed SNF and PACE's contract with [facility]- daughter agreeable to pursuing this at dc.  CSW will complete FL2 and PASARR for SNF.   Employment status:  Retired Nurse, adult, Medicaid In [place] PT Recommendations:  [place] / Referral to community resources:  [place]  Patient/Family's Response to care:  Daughter concerned about her foley and kidney function- she also notices   Patient/Family's Understanding of and Emotional Response to Diagnosis, Current Treatment, and Prognosis: Family understands the treatment plan and her needs at this  time. They are hopeful for her to return home after SNF stay.  Family is very focused on caring for mom- they are committed to taking care of her and getting her back home after a SNF stay.  Emotional Assessment Appearance:  Appears younger than stated age Attitude/Demeanor/Rapport:  Unable to Assess Affect (typically observed):  Unable to Assess Orientation:  Fluctuating Orientation (Suspected and/or reported Sundowners) Alcohol / Substance use:  Not Applicable Psych involvement (Current and /or in the community):  No (Comment)  Discharge Needs  Concerns to be addressed:  Discharge Planning Concerns Readmission within the last 30 days:  No Current discharge risk:  Physical Impairment Barriers to Discharge:  No Barriers Identified   Ludwig Clarks, LCSW 09/07/2015, 11:24 AM

## 2015-09-07 NOTE — Progress Notes (Signed)
[facility]  ROUNDING NOTE   Subjective:  Patient moved to floor care. Doing well. Cr down to 0.96. Family at bedside.   Objective:  Vital signs in last 24 hours:  Temp:  [97.6 F (36.4 C)-98.7 F (37.1 C)] 98.3 F (36.8 C) (09/22 1104) Pulse Rate:  [54-105] 104 (09/22 1104) Resp:  [15-20] 18 (09/22 1104) BP: (106-186)/(57-87) 149/85 mmHg (09/22 1104) SpO2:  [91 %-100 %] 98 % (09/22 1104) FiO2 (%):  [21 %] 21 % (09/22 0756) Weight:  [78.155 kg (172 lb 4.8 oz)] 78.155 kg (172 lb 4.8 oz) (09/22 0500)  Weight change: 2.255 kg (4 lb 15.5 oz) Filed Weights   09/05/15 0458 09/06/15 0500 09/07/15 0500  Weight: 79.9 kg (176 lb 2.4 oz) 75.9 kg (167 lb 5.3 oz) 78.155 kg (172 lb 4.8 oz)    Intake/Output: I/O last 3 completed shifts: In: 6 [P.O.:380; I.V.:1200; IV Piggyback:100] Out: 1100 [Urine:1100]   Intake/Output this shift:  Total I/O In: -  Out: 250 [Urine:250]  Physical Exam: General: Elderly female, NAD  Head: Normocephalic, atraumatic. Moist oral mucosal membranes  Eyes: Anicteric, eyes open  Neck: Supple, trachea midline  Lungs:  Clear to auscultation normal effort  Heart: S1S2 no rubs  Abdomen:  Soft, nontender, BS present   Extremities: no peripheral edema.  Neurologic: Nonfocal, moving all four extremities  Skin: No lesions       Basic Metabolic Panel:  Recent Labs Lab 09/03/15 0428 09/04/15 0346 09/05/15 0400 09/06/15 0512 09/07/15 0632  NA 130* 128* 132* 138 137  K 4.4 4.5 3.9 3.5 3.9  CL 98* 97* 101 106 106  CO2 24 22 21* 21* 23  GLUCOSE 126* 164* 149* 107* 112*  BUN 47* 47* 46* 29* 22*  CREATININE 2.78* 2.74* 1.84* 1.08* 0.96  CALCIUM 7.8* 7.7* 8.0* 7.4* 8.3*    Liver Function Tests:  Recent Labs Lab 09/01/15 2341  AST 28  ALT 16  ALKPHOS 78  BILITOT 0.8  PROT 6.4*  ALBUMIN 3.6    Recent Labs Lab 09/01/15 2341  LIPASE 41   No results for input(s): AMMONIA in the last 168 hours.  CBC:  Recent Labs Lab  09/01/15 2341 09/02/15 0612 09/03/15 0428 09/04/15 0346 09/06/15 0512 09/07/15 0632  WBC 29.0* 28.7* 29.1* 21.9* 8.3  --   NEUTROABS 22.0*  --   --   --   --   --   HGB 9.7* 9.0* 8.7* 8.7* 8.0* 8.6*  HCT 30.0* 27.7* 26.5* 26.9* 24.4*  --   MCV 93.5 92.9 93.9 93.8 94.1  --   PLT 312 286 304 324 251  --     Cardiac Enzymes:  Recent Labs Lab 09/01/15 2341 09/02/15 0612 09/02/15 1347 09/02/15 1830 09/03/15 0114  TROPONINI 0.05* 0.05* 0.06* 0.06* 0.06*    BNP: Invalid input(s): POCBNP  CBG:  Recent Labs Lab 09/06/15 1616 09/06/15 2031 09/07/15 09/07/15 0526 09/07/15 1150  GLUCAP 139* 180* 135* 111* 147*    Microbiology: Results for orders placed or performed during the hospital encounter of 09/01/15  Urine culture     Status: None   Collection Time: 09/01/15 11:41 PM  Result Value Ref Range Status   Specimen Description URINE, RANDOM  Final   Special Requests NONE  Final   Culture NO GROWTH 2 DAYS  Final   Report Status 09/03/2015 FINAL  Final  Culture, blood (routine x 2)     Status: None   Collection Time: 09/02/15  1:28 AM  Result  Value Ref Range Status   Specimen Description BLOOD LEFT ASSIST CONTROL  Final   Special Requests BOTTLES DRAWN AEROBIC AND ANAEROBIC 4CC  Final   Culture NO GROWTH 5 DAYS  Final   Report Status 09/07/2015 FINAL  Final  Culture, blood (routine x 2)     Status: None   Collection Time: 09/02/15  1:28 AM  Result Value Ref Range Status   Specimen Description BLOOD RIGHT ASSIST CONTROL  Final   Special Requests BOTTLES DRAWN AEROBIC AND ANAEROBIC 4CC  Final   Culture NO GROWTH 5 DAYS  Final   Report Status 09/07/2015 FINAL  Final  Stool culture     Status: None   Collection Time: 09/02/15  9:10 AM  Result Value Ref Range Status   Specimen Description STOOL  Final   Special Requests NONE  Final   Culture   Final    NO CAMPYLOBACTER DETECTED NO SALMONELLA OR SHIGELLA ISOLATED No Pathogenic E. coli detected    Report Status  09/04/2015 FINAL  Final  MRSA PCR Screening     Status: None   Collection Time: 09/02/15  9:10 AM  Result Value Ref Range Status   MRSA by PCR NEGATIVE NEGATIVE Final    Comment:        The GeneXpert MRSA Assay (FDA approved for NASAL specimens only), is one component of a comprehensive MRSA colonization surveillance program. It is not intended to diagnose MRSA infection nor to guide or monitor treatment for MRSA infections.   C difficile quick scan w PCR reflex     Status: None   Collection Time: 09/02/15  9:10 AM  Result Value Ref Range Status   C Diff antigen NEGATIVE NEGATIVE Final   C Diff toxin NEGATIVE NEGATIVE Final   C Diff interpretation Negative for C. difficile  Final    Coagulation Studies: No results for input(s): LABPROT, INR in the last 72 hours.  Urinalysis: No results for input(s): COLORURINE, LABSPEC, PHURINE, GLUCOSEU, HGBUR, BILIRUBINUR, KETONESUR, PROTEINUR, UROBILINOGEN, NITRITE, LEUKOCYTESUR in the last 72 hours.  Invalid input(s): APPERANCEUR    Imaging: No results found.   Medications:   . sodium chloride 50 mL/hr at 09/06/15 1119  . norepinephrine Stopped (09/05/15 1200)   . aspirin EC  81 mg Oral Daily  . budesonide (PULMICORT) nebulizer solution  0.25 mg Nebulization BID  . clopidogrel  75 mg Oral Daily  . heparin  5,000 Units Subcutaneous 3 times per day  . insulin aspart  0-9 Units Subcutaneous 6 times per day  . pantoprazole  40 mg Oral Daily  . piperacillin-tazobactam (ZOSYN)  IV  3.375 g Intravenous 3 times per day  . sodium chloride  3 mL Intravenous Q12H  . traMADol  50 mg Oral QHS  . Vitamin D (Ergocalciferol)  50,000 Units Oral Weekly   acetaminophen **OR** acetaminophen, albuterol, ondansetron **OR** ondansetron (ZOFRAN) IV, oxyCODONE-acetaminophen  Assessment/ Plan:  81 y.o. female  with a PMHX of diabetes type 2, chronic kidney disease, hypertension, peripheral vascular disease status post femoropopliteal bypass,  dementia, COPD, anemia, was admitted on 09/01/2015 with altered mental status.   1. ARF, due to ATN/CKD stage III baseline Cr 1.01/egfr 54/proteinuria. Home medication list includes quinapril, amlodipine which could certainly cause ATN in the setting of hypotension and poor by mouth intake.  - Cr now at or below baseline, doing well at the moment, would probably avoid ACEi as outpt as pt has potential for volume depletion.  Ok to stop IVFs.  2.  Hypotension:  Now resolved, will stop IVFs.   3.  Hyponatremia: Na 137 and acceptable, will stop IVFs as above.   4.  No further renal input, will sign off.    LOS: [place] 9/22/201612:01 PM

## 2015-09-07 NOTE — Progress Notes (Addendum)
Patient ID: [name], female   DOB: 1921/12/13, 81 y.o.   MRN: [facility] PROGRESS NOTE  PCP: Lavetta Nielsen, MD  HPI/Subjective: Patient is alert and answering questions, but confused and. Continues to have some wheezing for which she is given Pulmicort. Blood pressure has improved significantly as well as her kidney function. . Oral intake has improved, patient is taken off IV fluids by nephrologist. Significant pain at nighttime and patient's family is requesting pain medications at nighttime, pain is relatively well controlled with Tylenol alone and daytime.  Objective: Filed Vitals:   09/07/15 1104  BP: 149/85  Pulse: 104  Temp: 98.3 F (36.8 C)  Resp: 18    Filed Weights   09/05/15 0458 09/06/15 0500 09/07/15 0500  Weight: 79.9 kg (176 lb 2.4 oz) 75.9 kg (167 lb 5.3 oz) 78.155 kg (172 lb 4.8 oz)    ROS: Review of Systems  Constitutional: Negative for fever and chills.  Eyes: Negative for blurred vision.  Respiratory: Positive for cough and wheezing. Negative for shortness of breath.   Cardiovascular: Negative for chest pain.  Gastrointestinal: Negative for nausea, vomiting, abdominal pain, diarrhea and constipation.  Genitourinary: Negative for dysuria.  Musculoskeletal: Negative for joint pain.  Neurological: Negative for dizziness and headaches.   Exam: Physical Exam  HENT:  Nose: No mucosal edema.  Mouth/Throat: No oropharyngeal exudate or posterior oropharyngeal edema.  Eyes: Conjunctivae, EOM and lids are normal. Pupils are equal, round, and reactive to light.  Neck: No JVD present. Carotid bruit is not present. No edema present. No thyroid mass and no thyromegaly present.  Cardiovascular: S1 normal and S2 normal.  Exam reveals no gallop.   No murmur heard. Pulses:      Dorsalis pedis pulses are 2+ on the right side, and 2+ on the left side.  Respiratory: No respiratory distress. She has wheezes in the right middle field,  the right lower field, the left middle field and the left lower field. She has no rhonchi. She has no rales.  The wheezing seems transmitted from upper airway.  GI: Soft. Bowel sounds are normal. There is no tenderness.  Musculoskeletal:       Right ankle: She exhibits swelling.       Left ankle: She exhibits swelling.  Lymphadenopathy:    She has no cervical adenopathy.  Skin: Skin is warm. No rash noted. Nails show no clubbing.  Blackened fifth toe left foot. Blackened eschar left Achilles area.  Psychiatric: She has a normal mood and affect.    Data Reviewed: Basic Metabolic Panel:  Recent Labs Lab 09/03/15 0428 09/04/15 0346 09/05/15 0400 09/06/15 0512 09/07/15 0632  NA 130* 128* 132* 138 137  K 4.4 4.5 3.9 3.5 3.9  CL 98* 97* 101 106 106  CO2 24 22 21* 21* 23  GLUCOSE 126* 164* 149* 107* 112*  BUN 47* 47* 46* 29* 22*  CREATININE 2.78* 2.74* 1.84* 1.08* 0.96  CALCIUM 7.8* 7.7* 8.0* 7.4* 8.3*   Liver Function Tests:  Recent Labs Lab 09/01/15 2341  AST 28  ALT 16  ALKPHOS 78  BILITOT 0.8  PROT 6.4*  ALBUMIN 3.6    Recent Labs Lab 09/01/15 2341  LIPASE 41   CBC:  Recent Labs Lab 09/01/15 2341 09/02/15 0612 09/03/15 0428 09/04/15 0346 09/06/15 0512 09/07/15 0632  WBC 29.0* 28.7* 29.1* 21.9* 8.3  --   NEUTROABS 22.0*  --   --   --   --   --  HGB 9.7* 9.0* 8.7* 8.7* 8.0* 8.6*  HCT 30.0* 27.7* 26.5* 26.9* 24.4*  --   MCV 93.5 92.9 93.9 93.8 94.1  --   PLT 312 286 304 324 251  --    Cardiac Enzymes:  Recent Labs Lab 09/01/15 2341 09/02/15 0612 09/02/15 1347 09/02/15 1830 09/03/15 0114  TROPONINI 0.05* 0.05* 0.06* 0.06* 0.06*    CBG:  Recent Labs Lab 09/06/15 1616 09/06/15 2031 09/07/15 09/07/15 0526 09/07/15 1150  GLUCAP 139* 180* 135* 111* 147*    Recent Results (from the past 240 hour(s))  Urine culture     Status: None   Collection Time: 09/01/15 11:41 PM  Result Value Ref Range Status   Specimen Description URINE, RANDOM   Final   Special Requests NONE  Final   Culture NO GROWTH 2 DAYS  Final   Report Status 09/03/2015 FINAL  Final  Culture, blood (routine x 2)     Status: None   Collection Time: 09/02/15  1:28 AM  Result Value Ref Range Status   Specimen Description BLOOD LEFT ASSIST CONTROL  Final   Special Requests BOTTLES DRAWN AEROBIC AND ANAEROBIC 4CC  Final   Culture NO GROWTH 5 DAYS  Final   Report Status 09/07/2015 FINAL  Final  Culture, blood (routine x 2)     Status: None   Collection Time: 09/02/15  1:28 AM  Result Value Ref Range Status   Specimen Description BLOOD RIGHT ASSIST CONTROL  Final   Special Requests BOTTLES DRAWN AEROBIC AND ANAEROBIC 4CC  Final   Culture NO GROWTH 5 DAYS  Final   Report Status 09/07/2015 FINAL  Final  Stool culture     Status: None   Collection Time: 09/02/15  9:10 AM  Result Value Ref Range Status   Specimen Description STOOL  Final   Special Requests NONE  Final   Culture   Final    NO CAMPYLOBACTER DETECTED NO SALMONELLA OR SHIGELLA ISOLATED No Pathogenic E. coli detected    Report Status 09/04/2015 FINAL  Final  MRSA PCR Screening     Status: None   Collection Time: 09/02/15  9:10 AM  Result Value Ref Range Status   MRSA by PCR NEGATIVE NEGATIVE Final    Comment:        The GeneXpert MRSA Assay (FDA approved for NASAL specimens only), is one component of a comprehensive MRSA colonization surveillance program. It is not intended to diagnose MRSA infection nor to guide or monitor treatment for MRSA infections.   C difficile quick scan w PCR reflex     Status: None   Collection Time: 09/02/15  9:10 AM  Result Value Ref Range Status   C Diff antigen NEGATIVE NEGATIVE Final   C Diff toxin NEGATIVE NEGATIVE Final   C Diff interpretation Negative for C. difficile  Final     Studies: No results found.  Scheduled Meds: . amLODipine  5 mg Oral Daily  . aspirin EC  81 mg Oral Daily  . atenolol  50 mg Oral Daily  . budesonide (PULMICORT)  nebulizer solution  0.25 mg Nebulization BID  . clopidogrel  75 mg Oral Daily  . heparin  5,000 Units Subcutaneous 3 times per day  . insulin aspart  0-9 Units Subcutaneous 6 times per day  . pantoprazole  40 mg Oral Daily  . piperacillin-tazobactam (ZOSYN)  IV  3.375 g Intravenous 3 times per day  . sodium chloride  3 mL Intravenous Q12H  . traMADol  50 mg  Oral QHS  . Vitamin D (Ergocalciferol)  50,000 Units Oral Weekly   Continuous Infusions: . norepinephrine Stopped (09/05/15 1200)    Assessment/Plan:  1. Septic shock , due to pneumonia. Resolved now, off levophed. Of IV fluid hydration. Continue IV Zosyn. Off vancomycin since cultures are negative. All cultures are negative. Likely discharge patient on Augmentin tomorrow morning.  2. Acute renal failure on chronic kidney disease stage III- resolved now, off IV fluid hydration. Creatinine is down to 0.96 today. Appreciate nephrologist input 3. Peripheral vascular disease, with gangrenous left toe, blackened eschar on left Achilles area- on aspirin and Plavix, no intervention at present. No infection. She is being seen by Dr. Wyn Quaker, appreciate input, initiate patient on Ultram/tramadol at nighttime. Patient is to follow-up with Dr. dew for further recommendations, including operative therapy if needed, discussed this patient's family 4. Diabetes mellitus type 2- patient on sliding scale, blood glucose levels are ranging between 111-180, start patient on glipizide at lower dose as outpatient likely depending on her oral intake 5. Anemia- watching  hemoglobin closely , stable 6. History of coronary artery disease, stable. Continue patient on aspirin and Plavix as well as atenolol 7. Hyponatremia- likely due to dehydration , resolved now, off normal saline IV fluids. 8. Elevated troponin- demand ischemia from septic shock, restart patient on atenolol and Norvasc for blood pressure control. Continue aspirin, Plavix. 9. Bigeminy and trigeminy-  likely induced with pressors, remains tachycardic despite being of levophed, resume atenolol for myocardium stabilization 10. Hypertension, essential. Resume patient on atenolol as well as Norvasc not going to initiate on quinapril due to chronic renal failure, per nephrologist's recommendations  Code Status:     Code Status Orders        Start     Ordered   09-08-2015 0611  Do not attempt resuscitation (DNR)   Continuous    Question Answer Comment  In the event of cardiac or respiratory ARREST Do not call a "code blue"   In the event of cardiac or respiratory ARREST Do not perform Intubation, CPR, defibrillation or ACLS   In the event of cardiac or respiratory ARREST Use medication by any route, position, wound care, and other measures to relive pain and suffering. May use oxygen, suction and manual treatment of airway obstruction as needed for comfort.   Comments RN may pronounce death      Sep 08, 2015 0611     Family Communication: Family at bedside Disposition Plan: To be determined Discussed extensively with patient's daughter, all questions were answered, voiced understanding Consultants:  Gastroenterology  Antibiotics:  IV Zosyn  Time spent: 45 minutes. Prolonged discussion this patient's grandson was present during my interview. All questions were answered, he voiced understanding and appreciation, time spent approximately 10 minutes [facility]

## 2015-09-07 NOTE — Clinical Social Work Placement (Signed)
   CLINICAL SOCIAL WORK PLACEMENT  NOTE  Date:  09/07/2015  Patient Details  Name: [name] MRN: [id number] Date of Birth: 1921-06-03  Clinical Social Work is seeking post-discharge placement for this patient at the Skilled  Nursing Facility level of care (*CSW will initial, date and re-position this form in  chart as items are completed):  Yes   Patient/family provided with [facility] Clinical Social Work Department's list of facilities offering this level of care within the geographic area requested by the patient (or if unable, by the patient's family).  Yes   Patient/family informed of their freedom to choose among providers that offer the needed level of care, that participate in Medicare, Medicaid or managed care program needed by the patient, have an available bed and are willing to accept the patient.  Yes   Patient/family informed of [facility]'s ownership interest in [facility] and [facility], as well as of the fact that they are under no obligation to receive care at these facilities.  PASRR submitted to EDS on 09/07/15     PASRR number received on 09/07/15     Existing PASRR number confirmed on       FL2 transmitted to all facilities in geographic area requested by pt/family on 09/07/15     FL2 transmitted to all facilities within larger geographic area on       Patient informed that his/her managed care company has contracts with or will negotiate with certain facilities, including the following:            Patient/family informed of bed offers received.  Patient chooses bed at       Physician recommends and patient chooses bed at      Patient to be transferred to   on  .  Patient to be transferred to facility by       Patient family notified on   of transfer.  Name of family member notified:        PHYSICIAN Please prepare priority discharge summary, including medications     Additional Comment:     _______________________________________________ Liliana Cline, LCSW 09/07/2015, 11:55 AM

## 2015-09-07 NOTE — Progress Notes (Signed)
Speech Language Pathology Treatment: Dysphagia  Patient Details Name: KRYSTINA STRIETER MRN: [id number] DOB: 1921/01/06 Today's Date: 09/07/2015 Time: 7017-7939 SLP Time Calculation (min) (ACUTE ONLY): 40 min  Assessment / Plan / Recommendation Clinical Impression  Pt appears to be tolerating her current mech soft (dys. 3) diet w/ thin liquids via cup and straw following general aspiration precautions. Pt does require verbal cues to redirect to task of taking po's and reducing environmental distractions helps. Pt does require assistance feeding and encouragement for awareness of meal task. Due to lengthier oral phase time(suspect d/t Dementia), time b/t bites/sips is nec. For pt to clear completely; alternating food w/ liquid is helpful as well. Rec. Oral care esp. Post meals. Rec. Dietician f/u for supplements as indicated. Pt appears at her baseline functionally; Dtr agreed. No further skilled ST services indicated at this time. NSG updated.     HPI Other Pertinent Information: Pt is a 81 y.o. female with a known history of diabetes mellitus type 2, CK D stage III, hypertension, peripheral arterial disease status post femoropopliteal bypass, dementia, COPD, chronic anemia, on DO NOT RESUSCITATE status brought in with the complaints of decreased mental status since yesterday. According to the patient's daughters, she was not feeling well and not eating or drinking well for the past few days and was noted to have decreased mental status yesterday hence brought to the emergency room for further evaluation. Pt has improved in her mental status and was placed on a Soft diet by MD. Pt has now been placed on a Mech Soft (chopped meats) consistency diet. Dtr reported pt has been exhibiting oral phase s/s of dysphagia most notibly lengthy masticating and expectorating her foods/meats prior to admission and intermittently during admission. Dtr denied any s/s of aspiration when drinking thin liquids. She also  stated pt tends to think she has "snuff" in her mouth and won't open her mouth to eat/drink at times.    Pertinent Vitals Pain Assessment: No/denies pain  SLP Plan  All goals met    Recommendations Diet recommendations: Dysphagia 3 (mechanical soft);Thin liquid Liquids provided via: Cup;Straw (straw if no coughing noted) Medication Administration: Whole meds with liquid (whole w/ puree if nec. ) Supervision: Staff to assist with self feeding;Full supervision/cueing for compensatory strategies Compensations: Minimize environmental distractions;Slow rate;Small sips/bites;Follow solids with liquid Postural Changes and/or Swallow Maneuvers: Seated upright 90 degrees              Oral Care Recommendations: Oral care BID;Staff/trained caregiver to provide oral care Follow up Recommendations: None Plan: All goals met    GO    Orinda Kenner, MS, CCC-SLP  Watson,Katherine 09/07/2015, 1:42 PM

## 2015-09-07 NOTE — Care Management (Signed)
I have notified [facility] of patient move to this unit.

## 2015-09-08 ENCOUNTER — Inpatient Hospital Stay: Payer: Medicare (Managed Care)

## 2015-09-08 DIAGNOSIS — D72829 Elevated white blood cell count, unspecified: Secondary | ICD-10-CM

## 2015-09-08 DIAGNOSIS — I10 Essential (primary) hypertension: Secondary | ICD-10-CM

## 2015-09-08 DIAGNOSIS — R8281 Pyuria: Secondary | ICD-10-CM

## 2015-09-08 DIAGNOSIS — J181 Lobar pneumonia, unspecified organism: Secondary | ICD-10-CM

## 2015-09-08 DIAGNOSIS — E872 Acidosis, unspecified: Secondary | ICD-10-CM

## 2015-09-08 DIAGNOSIS — G9341 Metabolic encephalopathy: Secondary | ICD-10-CM

## 2015-09-08 DIAGNOSIS — I499 Cardiac arrhythmia, unspecified: Secondary | ICD-10-CM

## 2015-09-08 DIAGNOSIS — J189 Pneumonia, unspecified organism: Secondary | ICD-10-CM

## 2015-09-08 DIAGNOSIS — I96 Gangrene, not elsewhere classified: Secondary | ICD-10-CM

## 2015-09-08 LAB — GLUCOSE, CAPILLARY
GLUCOSE-CAPILLARY: 115 mg/dL — AB (ref 65–99)
GLUCOSE-CAPILLARY: 140 mg/dL — AB (ref 65–99)
GLUCOSE-CAPILLARY: 116 mg/dL — AB (ref 65–99)
Glucose-Capillary: 124 mg/dL — ABNORMAL HIGH (ref 65–99)
Glucose-Capillary: 130 mg/dL — ABNORMAL HIGH (ref 65–99)
Glucose-Capillary: 146 mg/dL — ABNORMAL HIGH (ref 65–99)

## 2015-09-08 LAB — PLATELET COUNT: Platelets: 267 10*3/uL (ref 150–440)

## 2015-09-08 MED ORDER — LEVOFLOXACIN 250 MG PO TABS
250.0000 mg | ORAL_TABLET | Freq: Every day | ORAL | Status: AC
Start: 2015-09-09 — End: ?

## 2015-09-08 MED ORDER — INSULIN ASPART 100 UNIT/ML ~~LOC~~ SOLN: 0.0000 [IU] | SUBCUTANEOUS | Status: AC

## 2015-09-08 MED ORDER — ONDANSETRON HCL 4 MG PO TABS: 4.0000 mg | ORAL_TABLET | Freq: Four times a day (QID) | ORAL | Status: AC | PRN

## 2015-09-08 MED ORDER — LEVOFLOXACIN 250 MG PO TABS
250.0000 mg | ORAL_TABLET | Freq: Every day | ORAL | Status: DC
  Administered 2015-09-09: 250 mg via ORAL
  Filled 2015-09-08: qty 1

## 2015-09-08 MED ORDER — LEVOFLOXACIN 500 MG PO TABS
500.0000 mg | ORAL_TABLET | Freq: Once | ORAL | Status: AC
  Administered 2015-09-08: 500 mg via ORAL
  Filled 2015-09-08: qty 1

## 2015-09-08 MED ORDER — ENSURE ENLIVE PO LIQD
237.0000 mL | Freq: Two times a day (BID) | ORAL | Status: DC
  Administered 2015-09-08 – 2015-09-09 (×2): 237 mL via ORAL

## 2015-09-08 MED ORDER — ASPIRIN 81 MG PO TBEC: 81.0000 mg | DELAYED_RELEASE_TABLET | Freq: Every day | ORAL | Status: AC

## 2015-09-08 MED ORDER — AMLODIPINE BESYLATE 10 MG PO TABS
10.0000 mg | ORAL_TABLET | Freq: Every day | ORAL | Status: DC
Start: 2015-09-09 — End: 2015-09-09
  Administered 2015-09-09: 10 mg via ORAL
  Filled 2015-09-08: qty 1

## 2015-09-08 MED ORDER — AMLODIPINE BESYLATE 10 MG PO TABS: 10.0000 mg | ORAL_TABLET | Freq: Every day | ORAL | Status: AC

## 2015-09-08 MED ORDER — TRAMADOL HCL 50 MG PO TABS: 50.0000 mg | ORAL_TABLET | Freq: Every day | ORAL | Status: AC

## 2015-09-08 NOTE — Clinical Social Work Note (Signed)
Patient to discharge today. When CSW contacted patient's daughter, Elaura, she informed CSW that she needed to go see [facility] before she would be in agreement to send patient there. She would call me once she visited the facility. I have updated patient's nurse and [facility] program. Patient's daughter stated patient would need to transport via EMS when time. Discharge summary sent to [facility] in event she goes there today and faxed to [facility]. York Spaniel MSW,LCSW [phone]

## 2015-09-08 NOTE — Clinical Social Work Note (Signed)
Patient's daughter, Kemiah, is in agreement with [facility] for today. CSW has informed patient's nurse and once patient's daughter returns to hospital, nursing will call report and ambulance. York Spaniel MSW,LCSW [phone]

## 2015-09-08 NOTE — Clinical Social Work Placement (Signed)
   CLINICAL SOCIAL WORK PLACEMENT  NOTE  Date:  09/08/2015  Patient Details  Name: [name] MRN: [id number] Date of Birth: [birth year]  Clinical Social Work is seeking post-discharge placement for this patient at the Skilled  Nursing Facility level of care (*CSW will initial, date and re-position this form in  chart as items are completed):  Yes   Patient/family provided with [facility] Clinical Social Work Department's list of facilities offering this level of care within the geographic area requested by the patient (or if unable, by the patient's family).  Yes   Patient/family informed of their freedom to choose among providers that offer the needed level of care, that participate in Medicare, Medicaid or managed care program needed by the patient, have an available bed and are willing to accept the patient.  Yes   Patient/family informed of [facility]'s ownership interest in [facility] and [facility], as well as of the fact that they are under no obligation to receive care at these facilities.  PASRR submitted to EDS on 09/07/15     PASRR number received on 09/07/15     Existing PASRR number confirmed on       FL2 transmitted to all facilities in geographic area requested by pt/family on 09/07/15     FL2 transmitted to all facilities within larger geographic area on       Patient informed that his/her managed care company has contracts with or will negotiate with certain facilities, including the following:        Yes   Patient/family informed of bed offers received.  Patient chooses bed at  [facility])     Physician recommends and patient chooses bed at  [facility])    Patient to be transferred to  [facility]) on 09/08/15.  Patient to be transferred to facility by  (EMS)     Patient family notified on 09/08/15 of transfer.  Name of family member notified:   (Daughter: [name])     PHYSICIAN       Additional Comment:     _______________________________________________ York Spaniel, LCSW 09/08/2015, 1:43 PM

## 2015-09-08 NOTE — Care Management Note (Signed)
Case Management Note  Patient Details  Name: [name] MRN: [id number] Date of Birth: 03-Jul-1921  Subjective/Objective:                    Action/Plan: Patient's daughter to go to [facility] and view the facility.  If she is in agreement patient will discharge to [facility] today.  Will notify [facility] at time of discharge    Expected Discharge Date:                  Expected Discharge Plan:  Home w Home Health Services  In-House Referral:     Discharge planning Services  CM Consult  Post Acute Care Choice:    Choice offered to:     DME Arranged:    DME Agency:     HH Arranged:    HH Agency:     Status of Service:  In process, will continue to follow  Medicare Important Message Given:  Yes-fourth notification given Date Medicare IM Given:    Medicare IM give by:    Date Additional Medicare IM Given:    Additional Medicare Important Message give by:     If discussed at Long Length of Stay Meetings, dates discussed:    Additional Comments:  Chapman Fitch, RN 09/08/2015, 11:33 AM

## 2015-09-08 NOTE — Care Management (Signed)
Patients daughter Dorrene expreses concerns about patient being discharged.  States that she is not eating and has has multiple stools.  MD has canceled discharge order.  Dietary is to send ensure to patient and nursing will see if she can tolerate.  Nursing to send stool sample when available.  MD to be updated with results.  When appropriate patient will be discharged to [facility]

## 2015-09-08 NOTE — Progress Notes (Signed)
Physical Therapy Treatment Patient Details Name: [name] MRN: [id number] DOB: May 16, 1921 Today's Date: 09/08/2015    History of Present Illness Patient is a 81 y/o female that presents with generalized sepsis and acute renal failure. Patient had been ambulatory as of July 2016, however she began to develop a small ulcer on her LLE and was found to have minimal circulation to this extremity. This ulcer has continued to increase in size and now presents with prior mentioned complaints. She has been mostly bed bound aside from stand pivot transfers to and from [facility] and rollator since July.     PT Comments    Patient is able to provide significantly improved supine to sit transfer and tolerates sitting for much longer than previous PT sessions. Patient is unable to tolerate any pressure on LLE, limiting her ability to transfer at this time. Patient displays improved cognition today and is able to perform all therapeutic exercises with minimal cuing. Patient continues to require significant assistance for mobility and would benefit from skilled rehab services to increase her level of mobility independence.  Follow Up Recommendations  SNF     Equipment Recommendations       Recommendations for Other Services       Precautions / Restrictions Precautions Precautions: Fall Restrictions Other Position/Activity Restrictions: Patient has not been officially given a WB status on LLE, will need a boot or WB status alteration given where her ulceration is and the pain she is experiencing.     Mobility  Bed Mobility Overal bed mobility: Needs Assistance Bed Mobility: Supine to Sit;Sit to Supine     Supine to sit: Min guard Sit to supine: Mod assist;+2 for physical assistance   General bed mobility comments: Patient much more alert today and able to bring her LEs off the side of the bed and elevate her torso with no physical assistance.   Transfers Overall transfer level: Needs  assistance   Transfers: Sit to/from Stand Sit to Stand: Max assist;+2 physical assistance         General transfer comment: Patient provided no effort through LLE secondary to pain, able to partially stand with max A x2. Deferred pivot transfer at this time due to dependency in lift.   Ambulation/Gait                 Stairs            Wheelchair Mobility    Modified Rankin (Stroke Patients Only)       Balance Overall balance assessment: Needs assistance Sitting-balance support: Feet unsupported;Bilateral upper extremity supported Sitting balance-Leahy Scale: Good Sitting balance - Comments: Patient displays no loss of balance in independent sitting ~ 10 minutes during this session.                             Cognition Arousal/Alertness: Awake/alert;Lethargic (Intermittent periods of lethargy and alertness throughout session) Behavior During Therapy: WFL for tasks assessed/performed Overall Cognitive Status: History of cognitive impairments - at baseline (Patient is able to follow commands and provide effort appropriately, still shades of confusion noted.)                      Exercises General Exercises - Lower Extremity Ankle Circles/Pumps: AROM;10 reps;Both Long Arc Quad: AROM;10 reps;Both Heel Slides: AROM;Both;10 reps Hip ABduction/ADduction: AROM;Both;10 reps Straight Leg Raises: AROM;Both;10 reps Hip Flexion/Marching: AROM;10 reps;Both    General Comments General comments (skin integrity, edema,  etc.): Ulceration on L achilles covered and darkened 5th toe.       Pertinent Vitals/Pain      Home Living                      Prior Function            PT Goals (current goals can now be found in the care plan section) Acute Rehab PT Goals Patient Stated Goal: To increase her level of mobility as able  PT Goal Formulation: With patient/family Time For Goal Achievement: 09/20/15 Potential to Achieve Goals:  Fair Progress towards PT goals: Progressing toward goals    Frequency  Min 2X/week    PT Plan Current plan remains appropriate    Co-evaluation             End of Session Equipment Utilized During Treatment: Gait belt Activity Tolerance: Patient limited by fatigue;Patient tolerated treatment well Patient left: in bed;with call bell/phone within reach;with family/visitor present;with nursing/sitter in room     Time: 4098-1191 PT Time Calculation (min) (ACUTE ONLY): 17 min  Charges:  $Therapeutic Exercise: 8-22 mins                    G Codes:      Kerin Ransom, PT, DPT    09/08/2015, 4:47 PM

## 2015-09-08 NOTE — Discharge Summary (Signed)
[facility]   PATIENT NAME: [name]    MR#:  [id number]  DATE OF BIRTH:  1921-02-04  DATE OF ADMISSION:  09/01/2015 ADMITTING PHYSICIAN: Crissie Figures, MD  DATE OF DISCHARGE: No discharge date for patient encounter.  PRIMARY CARE PHYSICIAN: Lavetta Nielsen, MD     ADMISSION DIAGNOSIS:  Sepsis, due to unspecified organism [A41.9]  DISCHARGE DIAGNOSIS:  Principal Problem:   Sepsis Active Problems:   Acute-on-chronic kidney injury   Enteritis   Metabolic acidosis   Acute encephalopathy   Hyponatremia   Elevated troponin   PAD (peripheral artery disease)   Leukocytosis   LLL pneumonia   Toe gangrene   Arrhythmia   Encephalopathy, metabolic   DM (diabetes mellitus)   HTN (hypertension)   Dementia   Anemia   Sterile pyuria   Essential hypertension   SECONDARY DIAGNOSIS:   Past Medical History  Diagnosis Date  . Abnormal heart rhythms   . Diabetes mellitus without complication   . Coronary artery disease   . Hyperlipidemia   . Hypertension   . Chronic kidney disease   . Varicose veins   . Peripheral vascular disease   . Anemia   . Arthritis     .pro HOSPITAL COURSE:  Patient is a 81 year old African-American female with history of peripheral vascular disease with left foot wounds and the toe gangrene, diabetes. CK D. Hypertension, coronary artery disease, dementia who presents to the hospital with decreased mental status, poor appetite. She was noted to have elevated white blood cell count to 29,000, and cried now of around 3, also elevation of troponin, and lactic acid. Patient was noted to be hypotensive. She was initiated on broad-spectrum antibiotic therapy and given IV fluids and admitted to the hospital. Patient's chest x-ray in the emergency room revealed left basilar opacity concerning for pneumonia, urinalysis revealed pyuria. CT scan of abdomen and pelvis revealed enteritis atherosclerosis of  abdominal aorta and its branches. Left lower lobe opacity, likely atelectasis. Patient was treated conservatively and improved. While in the hospital. She was seen by vascular surgeon, nephrologist, physical and speech therapist. She improved and was felt to be stable to be discharged to skilled nursing facility for rehabilitation.  Discussion by problem 1. Septic shock due to unknown etiologic agent as patient's blood cultures, urine cultures were negative , suspected due to pneumonia. Discharge patient on levofloxacin for 7 day course today .  2. Acute renal failure on chronic kidney disease stage III- resolved now, off IV fluid hydration. Creatinine is down to 0.96 yesterday. Appreciate nephrologist input, follow-up with nephrologist as outpatient. Nephrologist recommended to discontinue ACE inhibitor 3. Peripheral vascular disease, with gangrenous left toe, blackened eschar on left Achilles area- continue aspirin and Plavix, no intervention at present. No infection. She is being seen by Dr. Wyn Quaker, appreciate input, continue patient on Ultram/tramadol at nighttime to allow better rest. Patient is to follow-up with Dr. dew for further recommendations, including operative therapy if needed, discussed this patient's family yesterday and today extensively 4. Diabetes mellitus type 2- patient on sliding scale, blood glucose levels are ranging between 111-180, resume patient on glipizide  as outpatient depending on her oral intake, patient has poor by mouth intake today and had some loose stools overnight.  5. Anemia- watching hemoglobin closely , stable. Follow periodically, as patient is on aspirin and Plavix 6. History of coronary artery disease, stable. Continue patient on aspirin and Plavix as well as atenolol 7. Hyponatremia- likely  due to dehydration , resolved now, off normal saline IV fluids. 8. Elevated troponin- demand ischemia from septic shock, restarted patient on atenolol and Norvasc for blood  pressure control. Continue aspirin, Plavix. 9. Bigeminy and trigeminy- likely induced with pressors, , resolved, off levophed, resumed atenolol for myocardium stabilization 10. Hypertension, essential. Continue atenolol as well as Norvasc not going to initiate on quinapril due to chronic renal failure, per nephrologist's recommendations 11. Loose stools previously. Negative for enteral pathogenic bacteria as well as C. difficile. Check C. difficile again today if loose stools recur.  DISCHARGE CONDITIONS:   Stable  CONSULTS OBTAINED:  Treatment Team:  Mosetta Pigeon, MD Scot Jun, MD Annice Needy, MD  DRUG ALLERGIES:  No Known Allergies  DISCHARGE MEDICATIONS:   Current Discharge Medication List    START taking these medications   Details  insulin aspart (NOVOLOG) 100 UNIT/ML injection Inject 0-9 Units into the skin every 4 (four) hours. Qty: 10 mL, Refills: 11    levofloxacin (LEVAQUIN) 250 MG tablet Take 1 tablet (250 mg total) by mouth daily. Qty: 6 tablet, Refills: 0    ondansetron (ZOFRAN) 4 MG tablet Take 1 tablet (4 mg total) by mouth every 6 (six) hours as needed for nausea. Qty: 20 tablet, Refills: 0    traMADol (ULTRAM) 50 MG tablet Take 1 tablet (50 mg total) by mouth at bedtime. Qty: 30 tablet, Refills: 3      CONTINUE these medications which have CHANGED   Details  amLODipine (NORVASC) 10 MG tablet Take 1 tablet (10 mg total) by mouth daily. Qty: 30 tablet, Refills: 6      CONTINUE these medications which have NOT CHANGED   Details  albuterol (PROVENTIL HFA;VENTOLIN HFA) 108 (90 BASE) MCG/ACT inhaler Inhale into the lungs every 6 (six) hours as needed for wheezing or shortness of breath.    albuterol (PROVENTIL) (2.5 MG/3ML) 0.083% nebulizer solution Take 2.5 mg by nebulization every 6 (six) hours as needed for wheezing or shortness of breath.    atenolol (TENORMIN) 50 MG tablet Take 50 mg by mouth daily.    clopidogrel (PLAVIX) 75 MG tablet Take  75 mg by mouth daily.    ergocalciferol (VITAMIN D2) 50000 UNITS capsule Take 50,000 Units by mouth every 30 (thirty) days.     Fluticasone-Salmeterol (ADVAIR) 500-50 MCG/DOSE AEPB Inhale 1 puff into the lungs 2 (two) times daily.    glipiZIDE (GLUCOTROL XL) 10 MG 24 hr tablet Take 5 mg by mouth 2 (two) times daily.     loratadine (CLARITIN) 10 MG tablet Take 10 mg by mouth daily as needed for allergies.    omeprazole (PRILOSEC) 20 MG capsule Take 20 mg by mouth daily.    oxyCODONE-acetaminophen (PERCOCET/ROXICET) 5-325 MG per tablet Take 1 tablet by mouth every 4 (four) hours as needed for severe pain (every 4-6hours for pain).      STOP taking these medications     quinapril (ACCUPRIL) 40 MG tablet          DISCHARGE INSTRUCTIONS:    Patient is to follow-up with primary care physician, Dr. Shawn Stall. Nephrologist, as well as vascular surgeon as outpatient  If you experience worsening of your admission symptoms, develop shortness of breath, life threatening emergency, suicidal or homicidal thoughts you must seek medical attention immediately by calling 911 or calling your MD immediately  if symptoms less severe.  You Must read complete instructions/literature along with all the possible adverse reactions/side effects for all the Medicines you take and  that have been prescribed to you. Take any new Medicines after you have completely understood and accept all the possible adverse reactions/side effects.   Please note  You were cared for by a hospitalist during your hospital stay. If you have any questions about your discharge medications or the care you received while you were in the hospital after you are discharged, you can call the unit and asked to speak with the hospitalist on call if the hospitalist that took care of you is not available. Once you are discharged, your primary care physician will handle any further medical issues. Please note that NO REFILLS for any  discharge medications will be authorized once you are discharged, as it is imperative that you return to your primary care physician (or establish a relationship with a primary care physician if you do not have one) for your aftercare needs so that they can reassess your need for medications and monitor your lab values.    Today   CHIEF COMPLAINT:   Chief Complaint  Patient presents with  . Altered Mental Status    HISTORY OF PRESENT ILLNESS:  [name]  is a 81 y.o. female with a known history of peripheral vascular disease with left foot wounds and the toe gangrene, diabetes. CK D. Hypertension, coronary artery disease, dementia who presents to the hospital with decreased mental status, poor appetite. She was noted to have elevated white blood cell count to 29,000, and cried now of around 3, also elevation of troponin, and lactic acid. Patient was noted to be hypotensive. She was initiated on broad-spectrum antibiotic therapy and given IV fluids and admitted to the hospital. Patient's chest x-ray in the emergency room revealed left basilar opacity concerning for pneumonia, urinalysis revealed pyuria. CT scan of abdomen and pelvis revealed enteritis atherosclerosis of abdominal aorta and its branches. Left lower lobe opacity, likely atelectasis. Patient was treated conservatively and improved. While in the hospital. She was seen by vascular surgeon, nephrologist, physical and speech therapist. She improved and was felt to be stable to be discharged to skilled nursing facility for rehabilitation.  Discussion by problem 12. Septic shock due to unknown etiologic agent as patient's blood cultures, urine cultures were negative , suspected due to pneumonia. Discharge patient on levofloxacin for 7 day course today .  13. Acute renal failure on chronic kidney disease stage III- resolved now, off IV fluid hydration. Creatinine is down to 0.96 yesterday. Appreciate nephrologist input, follow-up with  nephrologist as outpatient. Nephrologist recommended to discontinue ACE inhibitor 14. Peripheral vascular disease, with gangrenous left toe, blackened eschar on left Achilles area- continue aspirin and Plavix, no intervention at present. No infection. She is being seen by Dr. Wyn Quaker, appreciate input, continue patient on Ultram/tramadol at nighttime to allow better rest. Patient is to follow-up with Dr. dew for further recommendations, including operative therapy if needed, discussed this patient's family yesterday and today extensively 15. Diabetes mellitus type 2- patient on sliding scale, blood glucose levels are ranging between 111-180, resume patient on glipizide  as outpatient depending on her oral intake, patient has poor by mouth intake today and had some loose stools overnight.  16. Anemia- watching hemoglobin closely , stable. Follow periodically, as patient is on aspirin and Plavix 17. History of coronary artery disease, stable. Continue patient on aspirin and Plavix as well as atenolol 18. Hyponatremia- likely due to dehydration , resolved now, off normal saline IV fluids. 19. Elevated troponin- demand ischemia from septic shock, restarted patient on atenolol and  Norvasc for blood pressure control. Continue aspirin, Plavix. 20. Bigeminy and trigeminy- likely induced with pressors, , resolved, off levophed, resumed atenolol for myocardium stabilization 21. Hypertension, essential. Continue atenolol as well as Norvasc not going to initiate on quinapril due to chronic renal failure, per nephrologist's recommendations 22. Loose stools previously. Negative for enteral pathogenic bacteria as well as C. difficile. Check C. difficile again today if loose stools recur.    VITAL SIGNS:  Blood pressure 176/97, pulse 79, temperature 97.7 F (36.5 C), temperature source Axillary, resp. rate 18, height 5\' 5"  (1.651 m), weight 81.239 kg (179 lb 1.6 oz), SpO2 98 %.  I/O:   Intake/Output Summary (Last 24  hours) at 09/08/15 1004 Last data filed at 09/07/15 2200  Gross per 24 hour  Intake    300 ml  Output    400 ml  Net   -100 ml    PHYSICAL EXAMINATION:  GENERAL:  81 y.o.-year-old patient lying in the bed with no acute distress.  EYES: Pupils equal, round, reactive to light and accommodation. No scleral icterus. Extraocular muscles intact.  HEENT: Head atraumatic, normocephalic. Oropharynx and nasopharynx clear.  NECK:  Supple, no jugular venous distention. No thyroid enlargement, no tenderness.  LUNGS: Normal breath sounds bilaterally, no wheezing, rales,rhonchi or crepitation. No use of accessory muscles of respiration.  CARDIOVASCULAR: S1, S2 normal. No murmurs, rubs, or gallops.  ABDOMEN: Soft, non-tender, non-distended. Bowel sounds present. No organomegaly or mass.  EXTREMITIES: No pedal edema, cyanosis, or clubbing.  NEUROLOGIC: Cranial nerves II through XII are intact. Muscle strength 5/5 in all extremities. Sensation intact. Gait not checked.  PSYCHIATRIC: The patient is alert and oriented x 3.  SKIN: No obvious rash, lesion, or ulcer.   DATA REVIEW:   CBC  Recent Labs Lab 09/06/15 0512 09/07/15 0632 09/08/15 0600  WBC 8.3  --   --   HGB 8.0* 8.6*  --   HCT 24.4*  --   --   PLT 251  --  267    Chemistries   Recent Labs Lab 09/01/15 2341  09/07/15 0632  NA 129*  < > 137  K 4.8  < > 3.9  CL 91*  < > 106  CO2 23  < > 23  GLUCOSE 260*  < > 112*  BUN 48*  < > 22*  CREATININE 3.16*  < > 0.96  CALCIUM 8.3*  < > 8.3*  AST 28  --   --   ALT 16  --   --   ALKPHOS 78  --   --   BILITOT 0.8  --   --   < > = values in this interval not displayed.  Cardiac Enzymes  Recent Labs Lab 09/03/15 0114  TROPONINI 0.06*    Microbiology Results  Results for orders placed or performed during the hospital encounter of 09/01/15  Urine culture     Status: None   Collection Time: 09/01/15 11:41 PM  Result Value Ref Range Status   Specimen Description URINE, RANDOM   Final   Special Requests NONE  Final   Culture NO GROWTH 2 DAYS  Final   Report Status 09/03/2015 FINAL  Final  Culture, blood (routine x 2)     Status: None   Collection Time: 09/02/15  1:28 AM  Result Value Ref Range Status   Specimen Description BLOOD LEFT ASSIST CONTROL  Final   Special Requests BOTTLES DRAWN AEROBIC AND ANAEROBIC 4CC  Final   Culture NO GROWTH 5 DAYS  Final   Report Status 09/07/2015 FINAL  Final  Culture, blood (routine x 2)     Status: None   Collection Time: 09/04/2015  1:28 AM  Result Value Ref Range Status   Specimen Description BLOOD RIGHT ASSIST CONTROL  Final   Special Requests BOTTLES DRAWN AEROBIC AND ANAEROBIC 4CC  Final   Culture NO GROWTH 5 DAYS  Final   Report Status 09/07/2015 FINAL  Final  Stool culture     Status: None   Collection Time: 04-Sep-2015  9:10 AM  Result Value Ref Range Status   Specimen Description STOOL  Final   Special Requests NONE  Final   Culture   Final    NO CAMPYLOBACTER DETECTED NO SALMONELLA OR SHIGELLA ISOLATED No Pathogenic E. coli detected    Report Status 09/04/2015 FINAL  Final  MRSA PCR Screening     Status: None   Collection Time: 09-04-15  9:10 AM  Result Value Ref Range Status   MRSA by PCR NEGATIVE NEGATIVE Final    Comment:        The GeneXpert MRSA Assay (FDA approved for NASAL specimens only), is one component of a comprehensive MRSA colonization surveillance program. It is not intended to diagnose MRSA infection nor to guide or monitor treatment for MRSA infections.   C difficile quick scan w PCR reflex     Status: None   Collection Time: 09/04/2015  9:10 AM  Result Value Ref Range Status   C Diff antigen NEGATIVE NEGATIVE Final   C Diff toxin NEGATIVE NEGATIVE Final   C Diff interpretation Negative for C. difficile  Final    RADIOLOGY:  No results found.  EKG:   Orders placed or performed during the hospital encounter of 09/01/15  . ED EKG  . ED EKG  . EKG 12-Lead  . EKG 12-Lead       Management plans discussed with the patient, family and they are in agreement.  CODE STATUS:     Code Status Orders        Start     Ordered   09-04-2015 [phone]  Do not attempt resuscitation (DNR)   Continuous    Question Answer Comment  In the event of cardiac or respiratory ARREST Do not call a "code blue"   In the event of cardiac or respiratory ARREST Do not perform Intubation, CPR, defibrillation or ACLS   In the event of cardiac or respiratory ARREST Use medication by any route, position, wound care, and other measures to relive pain and suffering. May use oxygen, suction and manual treatment of airway obstruction as needed for comfort.   Comments RN may pronounce death      Sep 04, 2015 0611      TOTAL TIME TAKING CARE OF THIS PATIENT: 45 minutes.    Katharina Caper M.D on 09/08/2015 at 10:04 AM  Between 7am to 6pm - Pager - [phone]  After 6pm go to [email or web address] - password [email or web address] [facility]  [facility] [place] [facility]  Office  [phone]  CC: Primary care physician; Lavetta Nielsen, MD

## 2015-09-08 NOTE — Progress Notes (Signed)
Pt was ready for discharge all lines removed as well as telemetry. Received call from DR [facility] around 1500 stating discharge was held due to patients family concern of not eating or drinking well. Orders received as well as acknowledgement line removal.

## 2015-09-08 NOTE — Care Management Important Message (Signed)
Important Message  Patient Details  Name: ARCADIA GORGAS MRN: [id number] Date of Birth: 05/03/1921   Medicare Important Message Given:  Yes-fourth notification given    Olegario Messier A Allmond 09/08/2015, 10:16 AM

## 2015-09-09 LAB — BASIC METABOLIC PANEL
ANION GAP: 9 (ref 5–15)
BUN: 12 mg/dL (ref 6–20)
CO2: 24 mmol/L (ref 22–32)
Calcium: 8.4 mg/dL — ABNORMAL LOW (ref 8.9–10.3)
Chloride: 105 mmol/L (ref 101–111)
Creatinine, Ser: 0.8 mg/dL (ref 0.44–1.00)
Glucose, Bld: 108 mg/dL — ABNORMAL HIGH (ref 65–99)
POTASSIUM: 3.6 mmol/L (ref 3.5–5.1)
SODIUM: 138 mmol/L (ref 135–145)

## 2015-09-09 LAB — GLUCOSE, CAPILLARY
GLUCOSE-CAPILLARY: 124 mg/dL — AB (ref 65–99)
GLUCOSE-CAPILLARY: 107 mg/dL — AB (ref 65–99)
Glucose-Capillary: 144 mg/dL — ABNORMAL HIGH (ref 65–99)

## 2015-09-09 LAB — HEMOGLOBIN: HEMOGLOBIN: 8.7 g/dL — AB (ref 12.0–16.0)

## 2015-09-09 NOTE — Progress Notes (Signed)
Discharge Note:  Pts VSS, Report called to Donivan Scull at [facility] EMS here to transport pt.

## 2015-09-09 NOTE — Progress Notes (Signed)
Brief Nutrition Note  Pt to be discharge to Skilled Nursing Facility today; pt only ate bites of french toast this AM, did not touch ground meats, home fries, canned fruit, etc. Per family, pt did eat a container of baby food. Family has drawer of puree baby food (veggies and fruits) in pt room that they have been giving her. Family reports pt tolerates this well, eating foods like yogurt, pudding and ice cream Not eating meats because she cannot chew them despite them being ground. Discussed option of downgrading diet to Dysphagia I or Puree with family as pt is currently on Dysphagia III/Mechanically Altered Diet. Family agreeable and likes this plan. Also discussed with MD Allena Katz, discussed the Speech is following. Orders changed to Dysphagia I in computer per family request. Pt is drinking some Ensure; also discussed Magic Cup and Mighty Shake supplements with them as well; pt can continue these at Skilled Nursing. Orders placed so that pt may try if she receives meal tray prior to discharge.   Romelle Starcher MS, RD, LDN [phone] Pager

## 2015-09-09 NOTE — Progress Notes (Signed)
Pt. Pleasantly confused. VSS. Pills crushed with applesauce. Pain in back controlled with PO pain meds per family request. Using bedpan and incontinent also. For d/c today. Will continue to monitor.

## 2015-09-09 NOTE — Discharge Instructions (Signed)
°  DIET:  Dysphagia 1 diet  DISCHARGE CONDITION:  Stable  ACTIVITY:  Activity as tolerated  OXYGEN:  Home Oxygen: No.   Oxygen Delivery: room air  DISCHARGE LOCATION:  home    ADDITIONAL DISCHARGE INSTRUCTION: pt eval and treat   If you experience worsening of your admission symptoms, develop shortness of breath, life threatening emergency, suicidal or homicidal thoughts you must seek medical attention immediately by calling 911 or calling your MD immediately  if symptoms less severe.  You Must read complete instructions/literature along with all the possible adverse reactions/side effects for all the Medicines you take and that have been prescribed to you. Take any new Medicines after you have completely understood and accpet all the possible adverse reactions/side effects.   Please note  You were cared for by a hospitalist during your hospital stay. If you have any questions about your discharge medications or the care you received while you were in the hospital after you are discharged, you can call the unit and asked to speak with the hospitalist on call if the hospitalist that took care of you is not available. Once you are discharged, your primary care physician will handle any further medical issues. Please note that NO REFILLS for any discharge medications will be authorized once you are discharged, as it is imperative that you return to your primary care physician (or establish a relationship with a primary care physician if you do not have one) for your aftercare needs so that they can reassess your need for medications and monitor your lab values.

## 2015-09-09 NOTE — Care Management Note (Signed)
Case Management Note  Patient Details  Name: [name] MRN: [id number] Date of Birth: [birth year]  Subjective/Objective:      Phone message left for Renea Ee that [name], a [facility] patient, has been transported to [facility].               Action/Plan:   Expected Discharge Date:                  Expected Discharge Plan:  Home w Home Health Services  In-House Referral:     Discharge planning Services  CM Consult  Post Acute Care Choice:    Choice offered to:     DME Arranged:    DME Agency:     HH Arranged:    HH Agency:     Status of Service:  In process, will continue to follow  Medicare Important Message Given:  Yes-fourth notification given Date Medicare IM Given:    Medicare IM give by:    Date Additional Medicare IM Given:    Additional Medicare Important Message give by:     If discussed at Long Length of Stay Meetings, dates discussed:    Additional Comments:  Rockett,Marilyn A, RN 09/09/2015, 4:28 PM

## 2015-09-09 NOTE — Discharge Summary (Signed)
[facility] - [place] at [facility]   PATIENT NAME: [name]    MR#:  [id number]  DATE OF BIRTH:  Jan 10, 1921  DATE OF ADMISSION:  09/01/2015 ADMITTING PHYSICIAN: Crissie Figures, MD  DATE OF DISCHARGE: No discharge date for patient encounter.  PRIMARY CARE PHYSICIAN: Lavetta Nielsen, MD     ADMISSION DIAGNOSIS:  Sepsis, due to unspecified organism [A41.9]  DISCHARGE DIAGNOSIS:  Principal Problem:   Sepsis Active Problems:   Acute encephalopathy   Acute-on-chronic kidney injury   Enteritis   Hyponatremia   Elevated troponin   DM (diabetes mellitus)   HTN (hypertension)   Dementia   PAD (peripheral artery disease)   Anemia   Metabolic acidosis   Leukocytosis   Sterile pyuria   LLL pneumonia   Toe gangrene   Essential hypertension   Arrhythmia   Encephalopathy, metabolic   SECONDARY DIAGNOSIS:   Past Medical History  Diagnosis Date  . Abnormal heart rhythms   . Diabetes mellitus without complication   . Coronary artery disease   . Hyperlipidemia   . Hypertension   . Chronic kidney disease   . Varicose veins   . Peripheral vascular disease   . Anemia   . Arthritis        HOSPITAL COURSE:  Patient is a 81 year old African-American female with history of peripheral vascular disease with left foot wounds and the toe gangrene, diabetes. CK D. Hypertension, coronary artery disease, dementia who presents to the hospital with decreased mental status, poor appetite. She was noted to have elevated white blood cell count to 29,000, and cried now of around 3, also elevation of troponin, and lactic acid. Patient was noted to be hypotensive. She was initiated on broad-spectrum antibiotic therapy and given IV fluids and admitted to the hospital. Patient's chest x-ray in the emergency room revealed left basilar opacity concerning for pneumonia, urinalysis revealed pyuria. CT scan of abdomen and pelvis revealed enteritis atherosclerosis of  abdominal aorta and its branches. Left lower lobe opacity, likely atelectasis. Patient was treated conservatively and improved. While in the hospital. She was seen by vascular surgeon, nephrologist, physical and speech therapist. She improved and was felt to be stable to be discharged to skilled nursing facility for rehabilitation.  Discussion by problem 1. Septic shock due to unknown etiologic agent as patient's blood cultures, urine cultures were negative , suspected due to pneumonia. Discharge patient on levofloxacin for 7 day course today .  2. Acute renal failure on chronic kidney disease stage III- resolved now, off IV fluid hydration. Creatinine is down to 0.96 yesterday. Appreciate nephrologist input, follow-up with nephrologist as outpatient. Nephrologist recommended to discontinue ACE inhibitor 3. Peripheral vascular disease, with gangrenous left toe, blackened eschar on left Achilles area- continue aspirin and Plavix, no intervention at present. No infection. She is being seen by Dr. Wyn Quaker, appreciate input, continue patient on Ultram/tramadol at nighttime to allow better rest. Patient is to follow-up with Dr. dew for further recommendations, including operative therapy if needed, discussed this patient's family yesterday and today extensively 4. Diabetes mellitus type 2- patient on sliding scale, blood glucose levels are ranging between 111-180, resume patient on glipizide  as outpatient depending on her oral intake, patient has poor by mouth intake today and had some loose stools overnight.  5. Anemia- watching hemoglobin closely , stable. Follow periodically, as patient is on aspirin and Plavix 6. History of coronary artery disease, stable. Continue patient on aspirin and Plavix as well as atenolol 7.  Hyponatremia- likely due to dehydration , resolved now, off normal saline IV fluids. 8. Elevated troponin- demand ischemia from septic shock, restarted patient on atenolol and Norvasc for blood  pressure control. Continue aspirin, Plavix. 9. Bigeminy and trigeminy- likely induced with pressors, , resolved, off levophed, resumed atenolol for myocardium stabilization 10. Hypertension, essential. Continue atenolol as well as Norvasc not going to initiate on quinapril due to chronic renal failure, per nephrologist's recommendations 11. Loose stools previously. Negative for enteral pathogenic bacteria as well as C. difficile. Check C. difficile again today if loose stools recur.  DISCHARGE CONDITIONS:   Stable  CONSULTS OBTAINED:  Treatment Team:  Mosetta Pigeon, MD Scot Jun, MD Annice Needy, MD  DRUG ALLERGIES:  No Known Allergies  DISCHARGE MEDICATIONS:   Current Discharge Medication List    START taking these medications   Details  aspirin EC 81 MG EC tablet Take 1 tablet (81 mg total) by mouth daily. Qty: 30 tablet, Refills: 3    insulin aspart (NOVOLOG) 100 UNIT/ML injection Inject 0-9 Units into the skin every 4 (four) hours. Qty: 10 mL, Refills: 11    levofloxacin (LEVAQUIN) 250 MG tablet Take 1 tablet (250 mg total) by mouth daily. Qty: 6 tablet, Refills: 0    ondansetron (ZOFRAN) 4 MG tablet Take 1 tablet (4 mg total) by mouth every 6 (six) hours as needed for nausea. Qty: 20 tablet, Refills: 0    traMADol (ULTRAM) 50 MG tablet Take 1 tablet (50 mg total) by mouth at bedtime. Qty: 30 tablet, Refills: 3      CONTINUE these medications which have CHANGED   Details  amLODipine (NORVASC) 10 MG tablet Take 1 tablet (10 mg total) by mouth daily. Qty: 30 tablet, Refills: 6      CONTINUE these medications which have NOT CHANGED   Details  albuterol (PROVENTIL HFA;VENTOLIN HFA) 108 (90 BASE) MCG/ACT inhaler Inhale into the lungs every 6 (six) hours as needed for wheezing or shortness of breath.    albuterol (PROVENTIL) (2.5 MG/3ML) 0.083% nebulizer solution Take 2.5 mg by nebulization every 6 (six) hours as needed for wheezing or shortness of breath.     atenolol (TENORMIN) 50 MG tablet Take 50 mg by mouth daily.    clopidogrel (PLAVIX) 75 MG tablet Take 75 mg by mouth daily.    ergocalciferol (VITAMIN D2) 50000 UNITS capsule Take 50,000 Units by mouth every 30 (thirty) days.     Fluticasone-Salmeterol (ADVAIR) 500-50 MCG/DOSE AEPB Inhale 1 puff into the lungs 2 (two) times daily.    loratadine (CLARITIN) 10 MG tablet Take 10 mg by mouth daily as needed for allergies.    omeprazole (PRILOSEC) 20 MG capsule Take 20 mg by mouth daily.    oxyCODONE-acetaminophen (PERCOCET/ROXICET) 5-325 MG per tablet Take 1 tablet by mouth every 4 (four) hours as needed for severe pain (every 4-6hours for pain).      STOP taking these medications     glipiZIDE (GLUCOTROL XL) 10 MG 24 hr tablet      quinapril (ACCUPRIL) 40 MG tablet          DISCHARGE INSTRUCTIONS:    Patient is to follow-up with primary care physician, Dr. Shawn Stall. Nephrologist, as well as vascular surgeon as outpatient  If you experience worsening of your admission symptoms, develop shortness of breath, life threatening emergency, suicidal or homicidal thoughts you must seek medical attention immediately by calling 911 or calling your MD immediately  if symptoms less severe.  You Must read  complete instructions/literature along with all the possible adverse reactions/side effects for all the Medicines you take and that have been prescribed to you. Take any new Medicines after you have completely understood and accept all the possible adverse reactions/side effects.   Please note  You were cared for by a hospitalist during your hospital stay. If you have any questions about your discharge medications or the care you received while you were in the hospital after you are discharged, you can call the unit and asked to speak with the hospitalist on call if the hospitalist that took care of you is not available. Once you are discharged, your primary care physician will handle any  further medical issues. Please note that NO REFILLS for any discharge medications will be authorized once you are discharged, as it is imperative that you return to your primary care physician (or establish a relationship with a primary care physician if you do not have one) for your aftercare needs so that they can reassess your need for medications and monitor your lab values.    Today   CHIEF COMPLAINT:   Chief Complaint  Patient presents with  . Altered Mental Status  feels better able to eat more today    HISTORY OF PRESENT ILLNESS:     VITAL SIGNS:  Blood pressure 144/67, pulse 90, temperature 98.4 F (36.9 C), temperature source Oral, resp. rate 18, height  (1.651 m), weight 81.239 kg (179 lb 1.6 oz), SpO2 94 %.  I/O:    Intake/Output Summary (Last 24 hours) at 09/09/15 0957 Last data filed at 09/08/15 1800  Gross per 24 hour  Intake    480 ml  Output      0 ml  Net    480 ml    PHYSICAL EXAMINATION:  GENERAL:  81 y.o.-year-old patient lying in the bed with no acute distress.  EYES: Pupils equal, round, reactive to light and accommodation. No scleral icterus. Extraocular muscles intact.  HEENT: Head atraumatic, normocephalic. Oropharynx and nasopharynx clear.  NECK:  Supple, no jugular venous distention. No thyroid enlargement, no tenderness.  LUNGS: Normal breath sounds bilaterally, no wheezing, rales,rhonchi or crepitation. No use of accessory muscles of respiration.  CARDIOVASCULAR: S1, S2 normal. No murmurs, rubs, or gallops.  ABDOMEN: Soft, non-tender, non-distended. Bowel sounds present. No organomegaly or mass.  EXTREMITIES: No pedal edema, cyanosis, or clubbing.  NEUROLOGIC: Cranial nerves II through XII are intact. Muscle strength 5/5 in all extremities. Sensation intact. Gait not checked.  PSYCHIATRIC: The patient is alert and oriented x 3.  SKIN: No obvious rash, lesion, or ulcer.   DATA REVIEW:   CBC  Recent Labs Lab 09/06/15 0512   09/08/15 0600 09/09/15 0503  WBC 8.3  --   --   --   HGB 8.0*  < >  --  8.7*  HCT 24.4*  --   --   --   PLT 251  --  267  --   < > = values in this interval not displayed.  Chemistries   Recent Labs Lab 09/09/15 0503  NA 138  K 3.6  CL 105  CO2 24  GLUCOSE 108*  BUN 12  CREATININE 0.80  CALCIUM 8.4*    Cardiac Enzymes  Recent Labs Lab 09/03/15 0114  TROPONINI 0.06*    Microbiology Results  Results for orders placed or performed during the hospital encounter of 09/01/15  Urine culture     Status: None   Collection Time: 09/01/15 11:41 PM  Result Value Ref Range Status   Specimen Description URINE, RANDOM  Final   Special Requests NONE  Final   Culture NO GROWTH 2 DAYS  Final   Report Status 09/03/2015 FINAL  Final  Culture, blood (routine x 2)     Status: None   Collection Time: 09/02/15  1:28 AM  Result Value Ref Range Status   Specimen Description BLOOD LEFT ASSIST CONTROL  Final   Special Requests BOTTLES DRAWN AEROBIC AND ANAEROBIC 4CC  Final   Culture NO GROWTH 5 DAYS  Final   Report Status 09/07/2015 FINAL  Final  Culture, blood (routine x 2)     Status: None   Collection Time: 09/02/15  1:28 AM  Result Value Ref Range Status   Specimen Description BLOOD RIGHT ASSIST CONTROL  Final   Special Requests BOTTLES DRAWN AEROBIC AND ANAEROBIC 4CC  Final   Culture NO GROWTH 5 DAYS  Final   Report Status 09/07/2015 FINAL  Final  Stool culture     Status: None   Collection Time: 09/02/15  9:10 AM  Result Value Ref Range Status   Specimen Description STOOL  Final   Special Requests NONE  Final   Culture   Final    NO CAMPYLOBACTER DETECTED NO SALMONELLA OR SHIGELLA ISOLATED No Pathogenic E. coli detected    Report Status 09/04/2015 FINAL  Final  MRSA PCR Screening     Status: None   Collection Time: 09/02/15  9:10 AM  Result Value Ref Range Status   MRSA by PCR NEGATIVE NEGATIVE Final    Comment:        The GeneXpert MRSA Assay (FDA approved for NASAL  specimens only), is one component of a comprehensive MRSA colonization surveillance program. It is not intended to diagnose MRSA infection nor to guide or monitor treatment for MRSA infections.   C difficile quick scan w PCR reflex     Status: None   Collection Time: 09/02/15  9:10 AM  Result Value Ref Range Status   C Diff antigen NEGATIVE NEGATIVE Final   C Diff toxin NEGATIVE NEGATIVE Final   C Diff interpretation Negative for C. difficile  Final    RADIOLOGY:  Dg Abd 2 Views  09/08/2015   CLINICAL DATA:  Abdominal pain  EXAM: ABDOMEN - 2 VIEW  COMPARISON:  None.  FINDINGS: Scattered large and small bowel gas is noted. Diffuse vascular calcifications are seen. Degenerative changes of lumbar spine are noted. No free air or obstructive changes are noted.  IMPRESSION: No acute abnormality is seen.   Electronically Signed   By: Alcide Clever M.D.   On: 09/08/2015 19:20    EKG:   Orders placed or performed during the hospital encounter of 09/01/15  . ED EKG  . ED EKG  . EKG 12-Lead  . EKG 12-Lead      Management plans discussed with the patient, family and they are in agreement.  CODE STATUS:     Code Status Orders        Start     Ordered   09/02/15 [phone]  Do not attempt resuscitation (DNR)   Continuous    Question Answer Comment  In the event of cardiac or respiratory ARREST Do not call a "code blue"   In the event of cardiac or respiratory ARREST Do not perform Intubation, CPR, defibrillation or ACLS   In the event of cardiac or respiratory ARREST Use medication by any route, position, wound care, and other measures to relive  pain and suffering. May use oxygen, suction and manual treatment of airway obstruction as needed for comfort.   Comments RN may pronounce death      09-18-15 0611      TOTAL TIME TAKING CARE OF THIS PATIENT: 45 minutes.    Auburn Bilberry M.D on 09/09/2015 at 9:57 AM  Between 7am to 6pm - Pager - [phone]  After 6pm go to  [email or web address] - password [email or web address] [facility]  [facility]  Office  [phone]  CC: Primary care physician; Lavetta Nielsen, MD

## 2015-09-12 LAB — GLUCOSE, CAPILLARY
GLUCOSE-CAPILLARY: 120 mg/dL — AB (ref 65–99)
Glucose-Capillary: 110 mg/dL — ABNORMAL HIGH (ref 65–99)

## 2015-10-17 DEATH — deceased

## 2016-01-23 IMAGING — US US RENAL
1 series · 14 of 25 positions shown · non-contrast
Comparison: CT abdomen and pelvis 09/02/2015.

CLINICAL DATA: Acute renal failure.

EXAM:
RENAL / URINARY TRACT ULTRASOUND COMPLETE

[Series 1: us renal · 0.20mm/px · 14 of 48 slices shown]
[im 1/48]
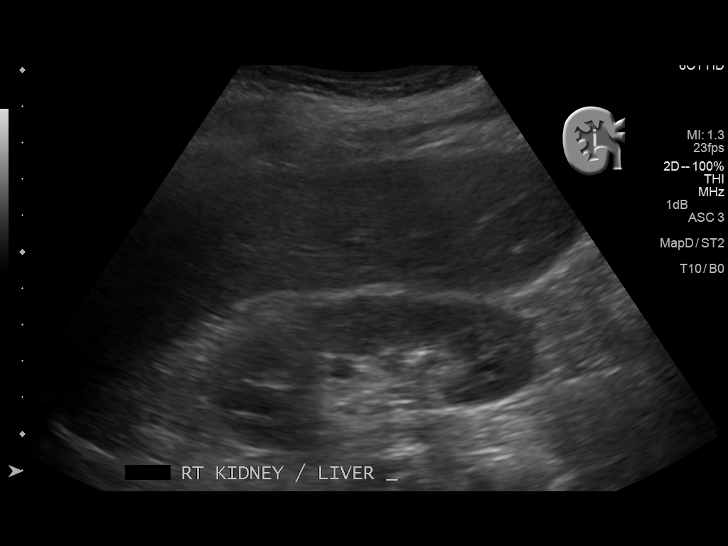
[im 4/48]
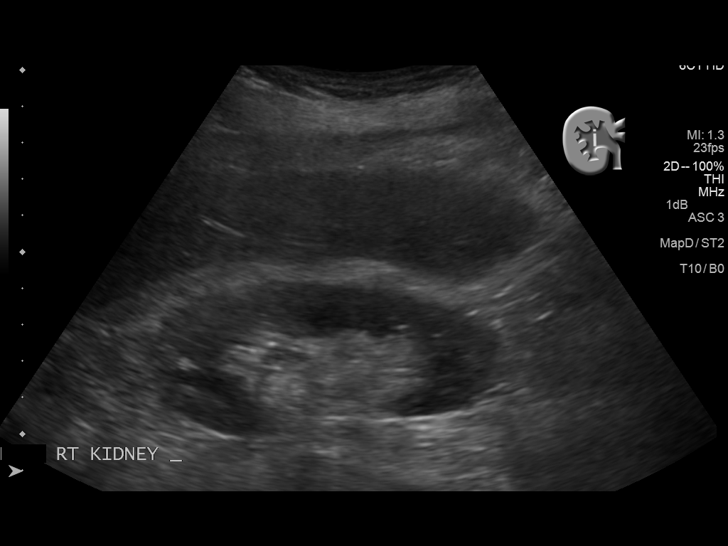
[im 8/48]
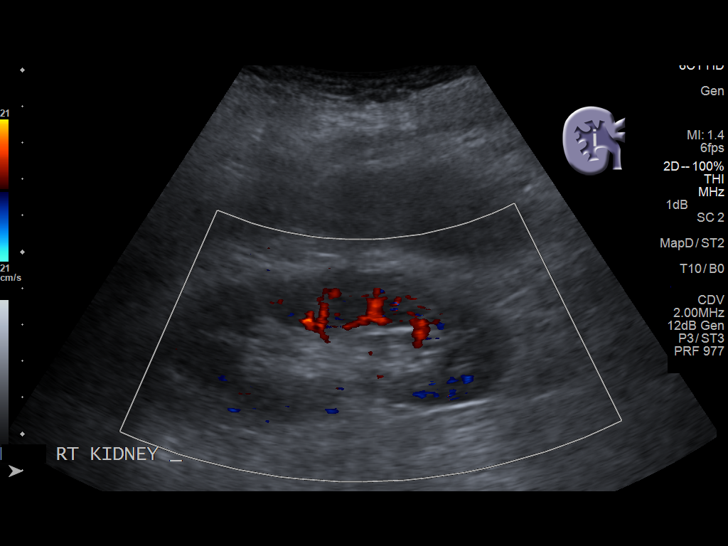
[im 12/48]
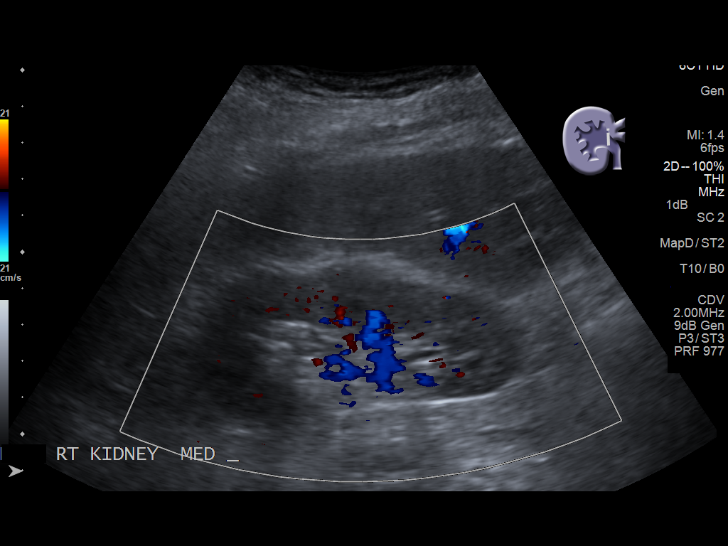
[im 16/48]
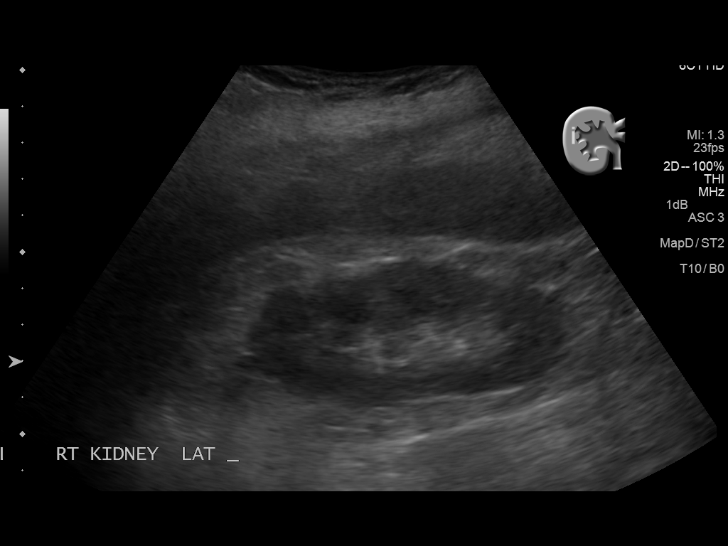
[im 18/48]
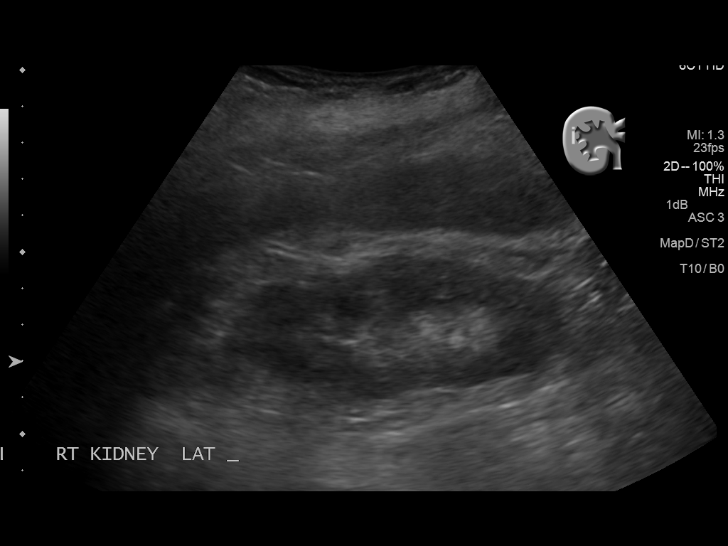
[im 22/48]
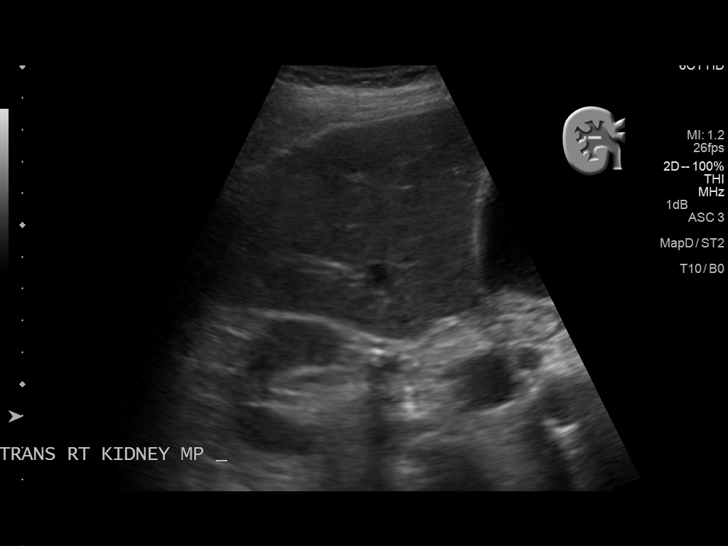
[im 26/48]
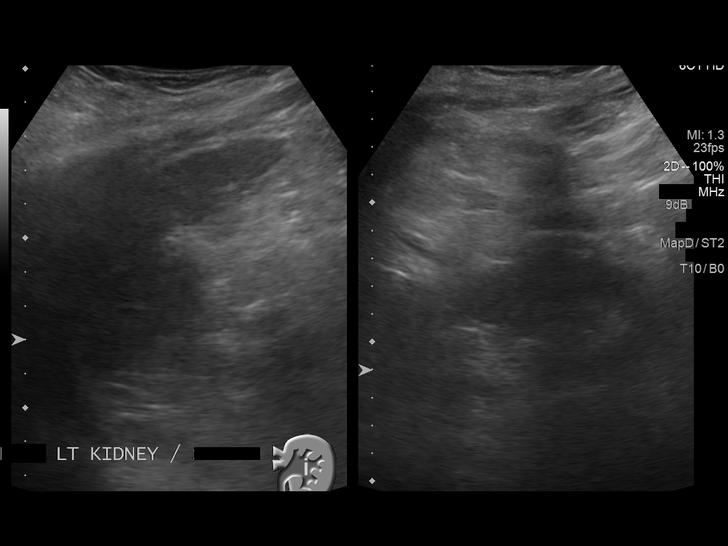
[im 30/48]
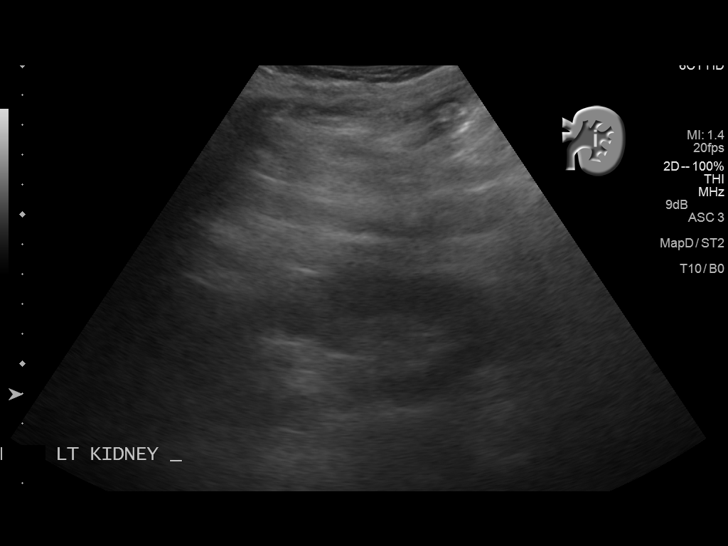
[im 32/48]
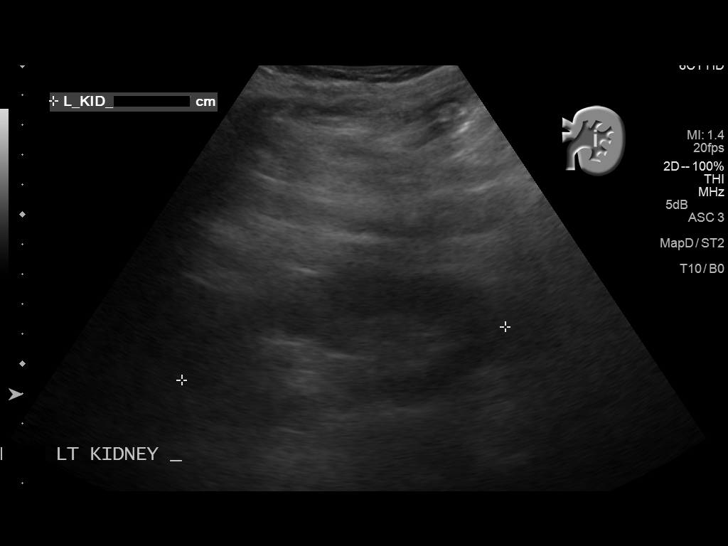
[im 36/48]
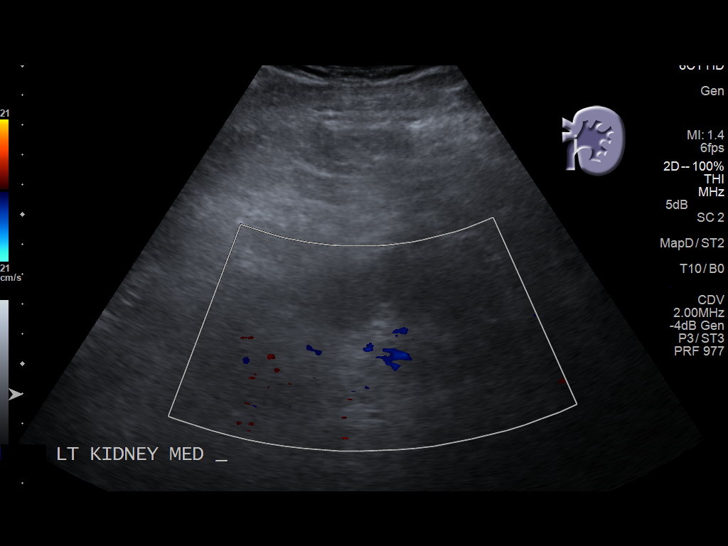
[im 40/48]
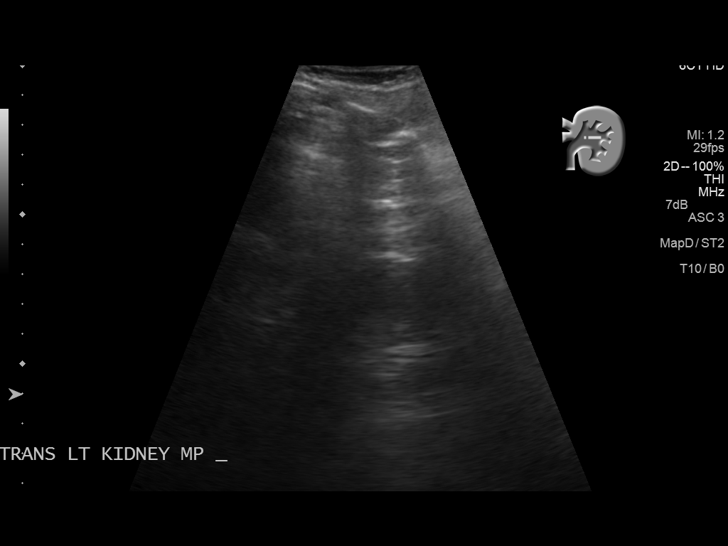
[im 44/48]
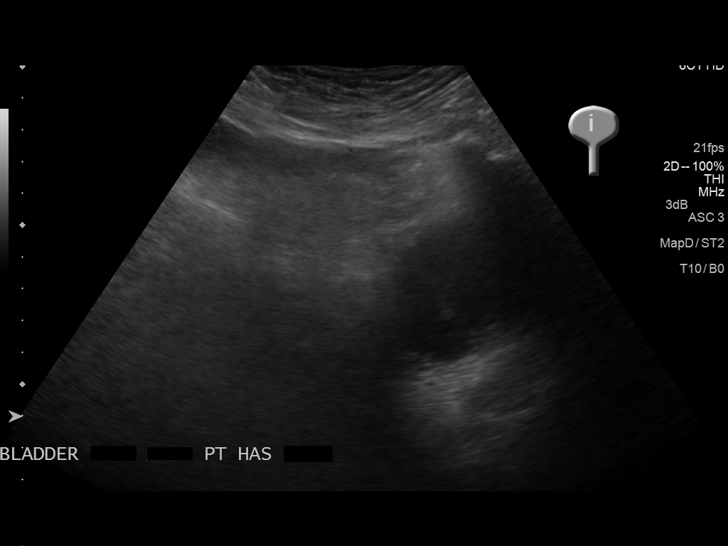
[im 48/48]
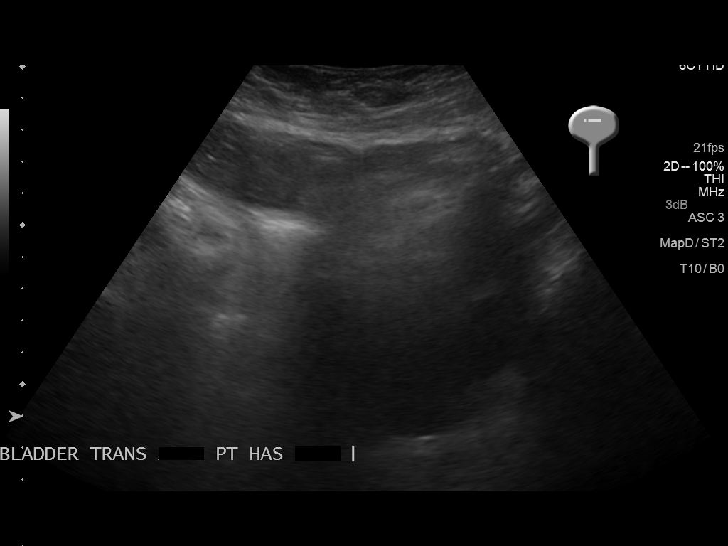

[14 of 25 positions shown; findings below may reference images not displayed]

FINDINGS: Right Kidney:

Length: 9.7 cm. Echogenicity within normal limits. No mass or
hydronephrosis visualized.

Left Kidney:

Length: 11.0 cm. Echogenicity within normal limits. No mass or
hydronephrosis visualized.

Bladder:

Appears normal for degree of bladder distention.
IMPRESSION: Negative for hydronephrosis.  Negative exam.

## 2017-03-12 IMAGING — CT CT HEAD W/O CM
2 series · 16 of 30 positions shown, 20 images · non-contrast
Comparison: CT 11/06/2010

CLINICAL DATA: Weakness and altered mental status.

EXAM:
CT HEAD WITHOUT CONTRAST
TECHNIQUE: Contiguous axial images were obtained from the base of the skull
through the vertex without intravenous contrast.

[Series 2: head wo · axial · 0.43mm/px · z∈[-92,-52]mm · 3 of 31 slices shown]
[im 3/31  brain]
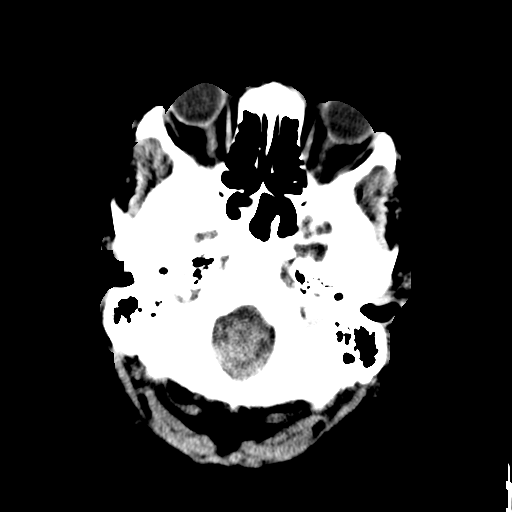
[im 7/31  brain]
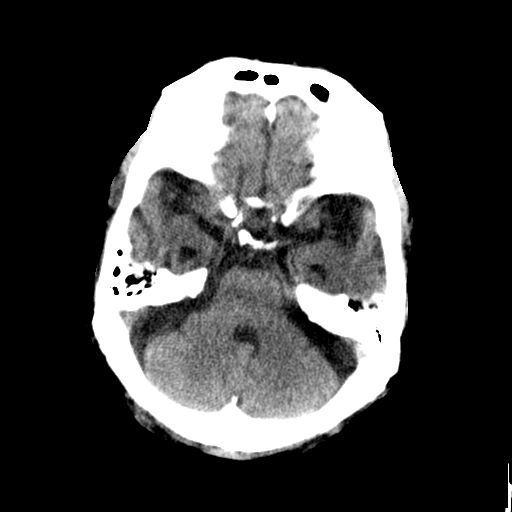
[im 11/31  brain]
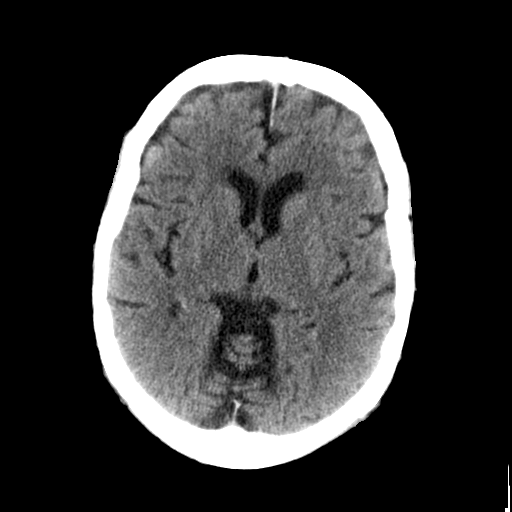

[Series 4: head wo recon · axial · 0.43mm/px · z∈[-77,+43]mm · 13 of 30 slices shown, 17 images]
[im 3/30  brain]
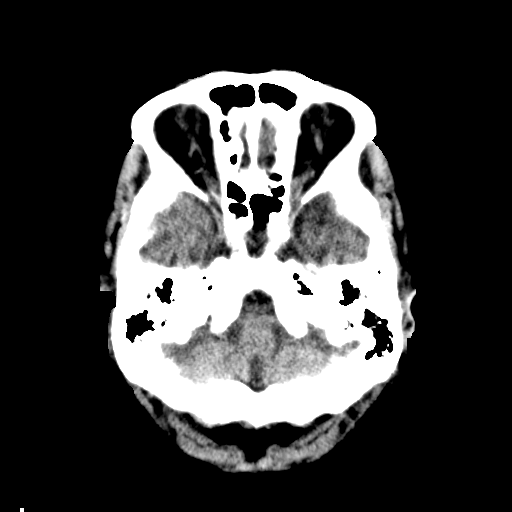
[im 3/30  bone]
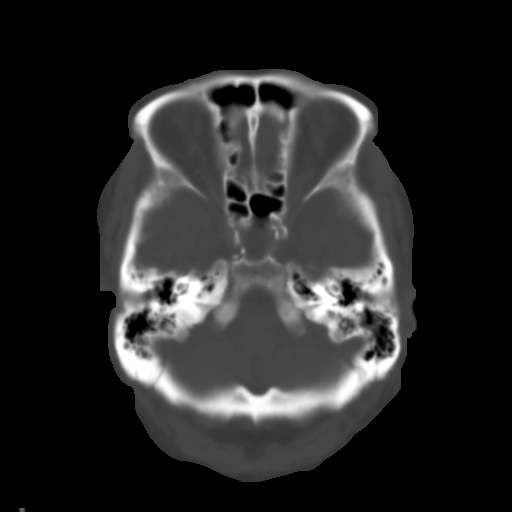
[im 5/30  brain]
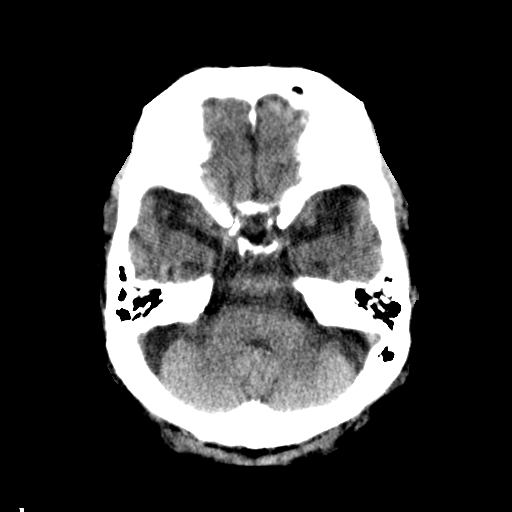
[im 7/30  brain]
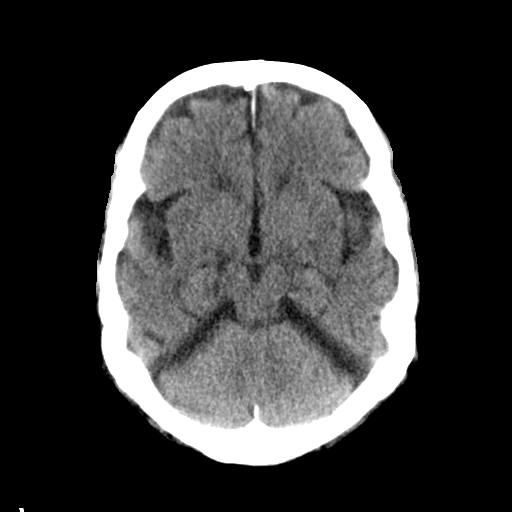
[im 9/30  brain]
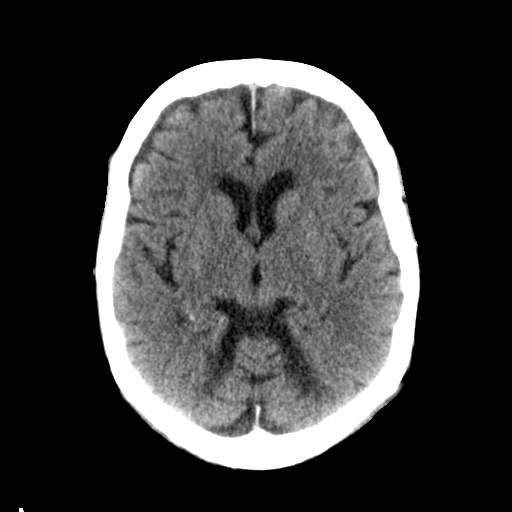
[im 11/30  brain]
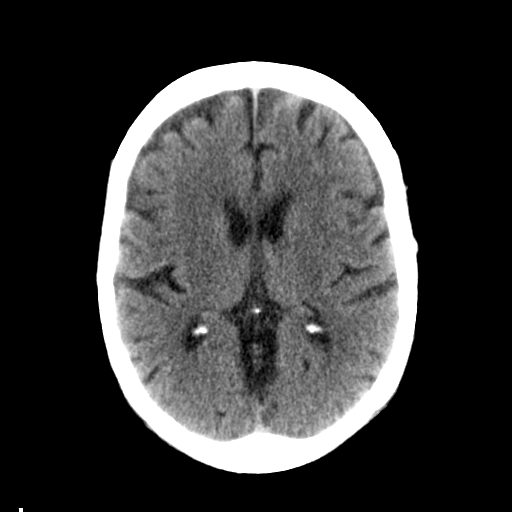
[im 11/30  bone]
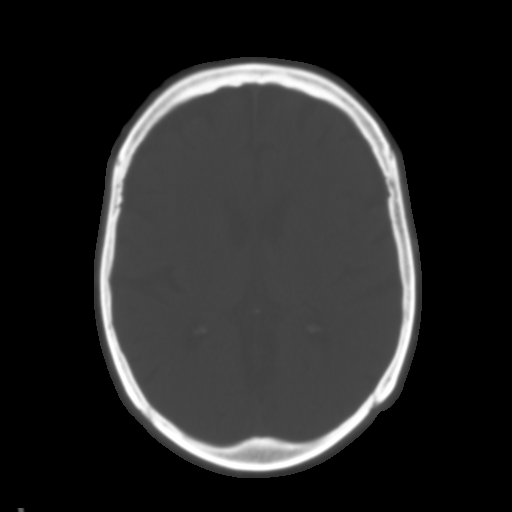
[im 13/30  brain]
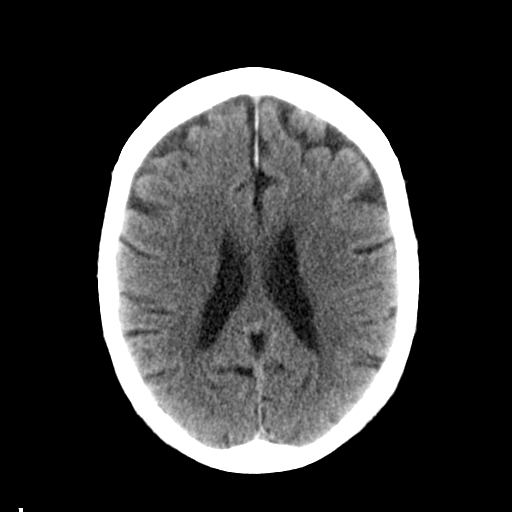
[im 15/30  brain]
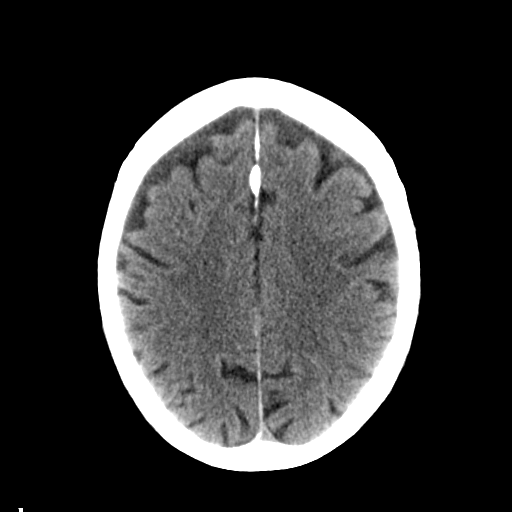
[im 17/30  brain]
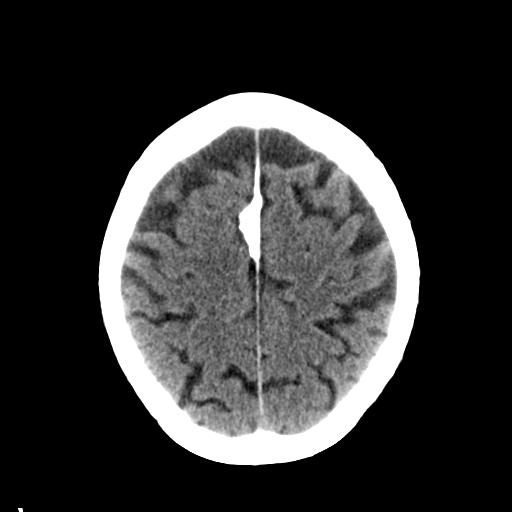
[im 19/30  brain]
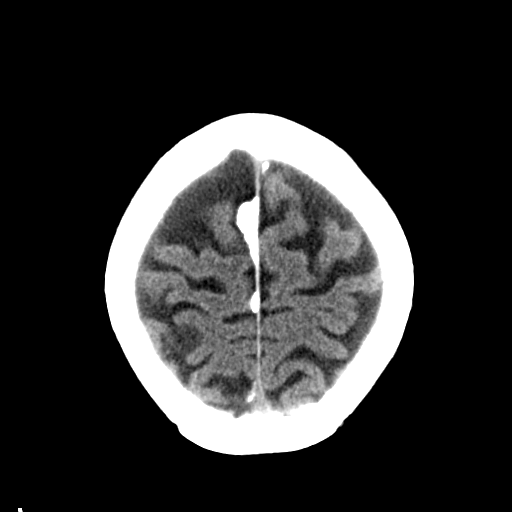
[im 19/30  bone]
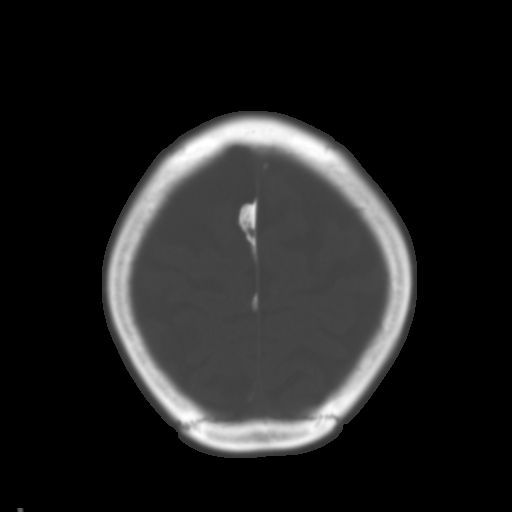
[im 21/30  brain]
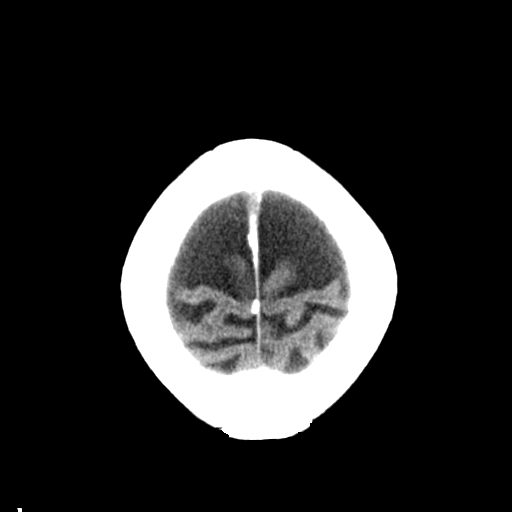
[im 23/30  brain]
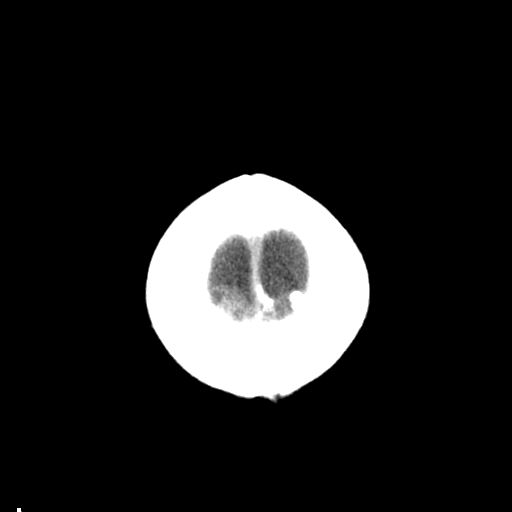
[im 25/30  brain]
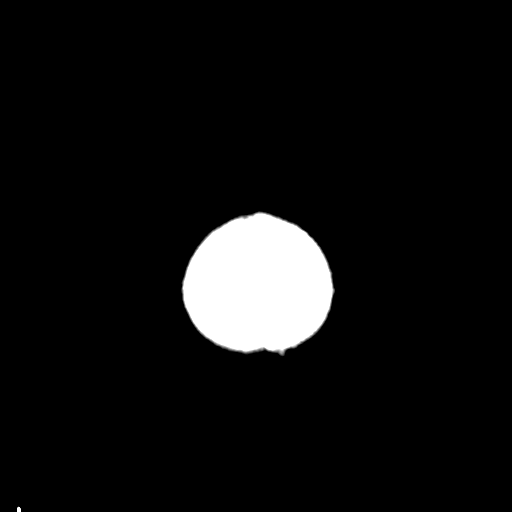
[im 27/30  brain]
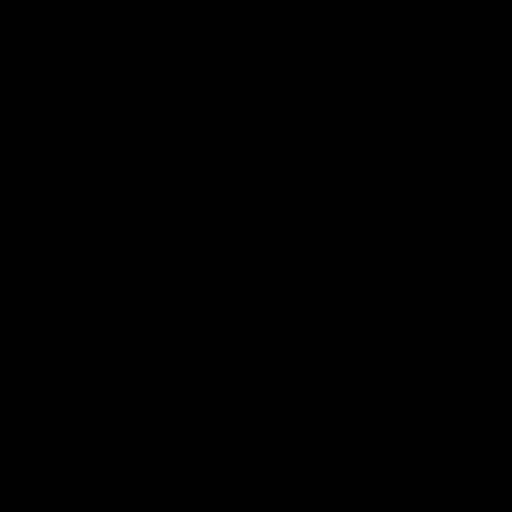
[im 27/30  bone]
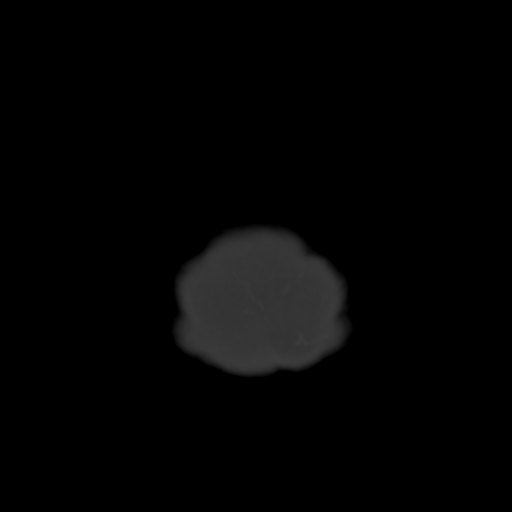

[16 of 30 positions shown; findings below may reference images not displayed]

FINDINGS: Generalized atrophy, most significant in the high bifrontal regions.
Mild chronic small vessel ischemic change. No intracranial
hemorrhage, mass effect, or midline shift. No hydrocephalus. The
basilar cisterns are patent. No evidence of territorial infarct. No
intracranial fluid collection. Atherosclerotic calcifications of the
skullbase vasculature. Calvarium is intact. Included paranasal
sinuses and mastoid air cells are well aerated.
IMPRESSION: Atrophy and chronic small vessel ischemic change. No acute
intracranial abnormality.

## 2017-03-18 IMAGING — CR DG ABDOMEN 2V
1 series · 2 of 2 positions shown · non-contrast
Comparison: None.

CLINICAL DATA: Abdominal pain

EXAM:
ABDOMEN - 2 VIEW

[Series 1: dg abd 2 views · 0.14mm/px · 2 of 2 slices shown]
[im 1/2]
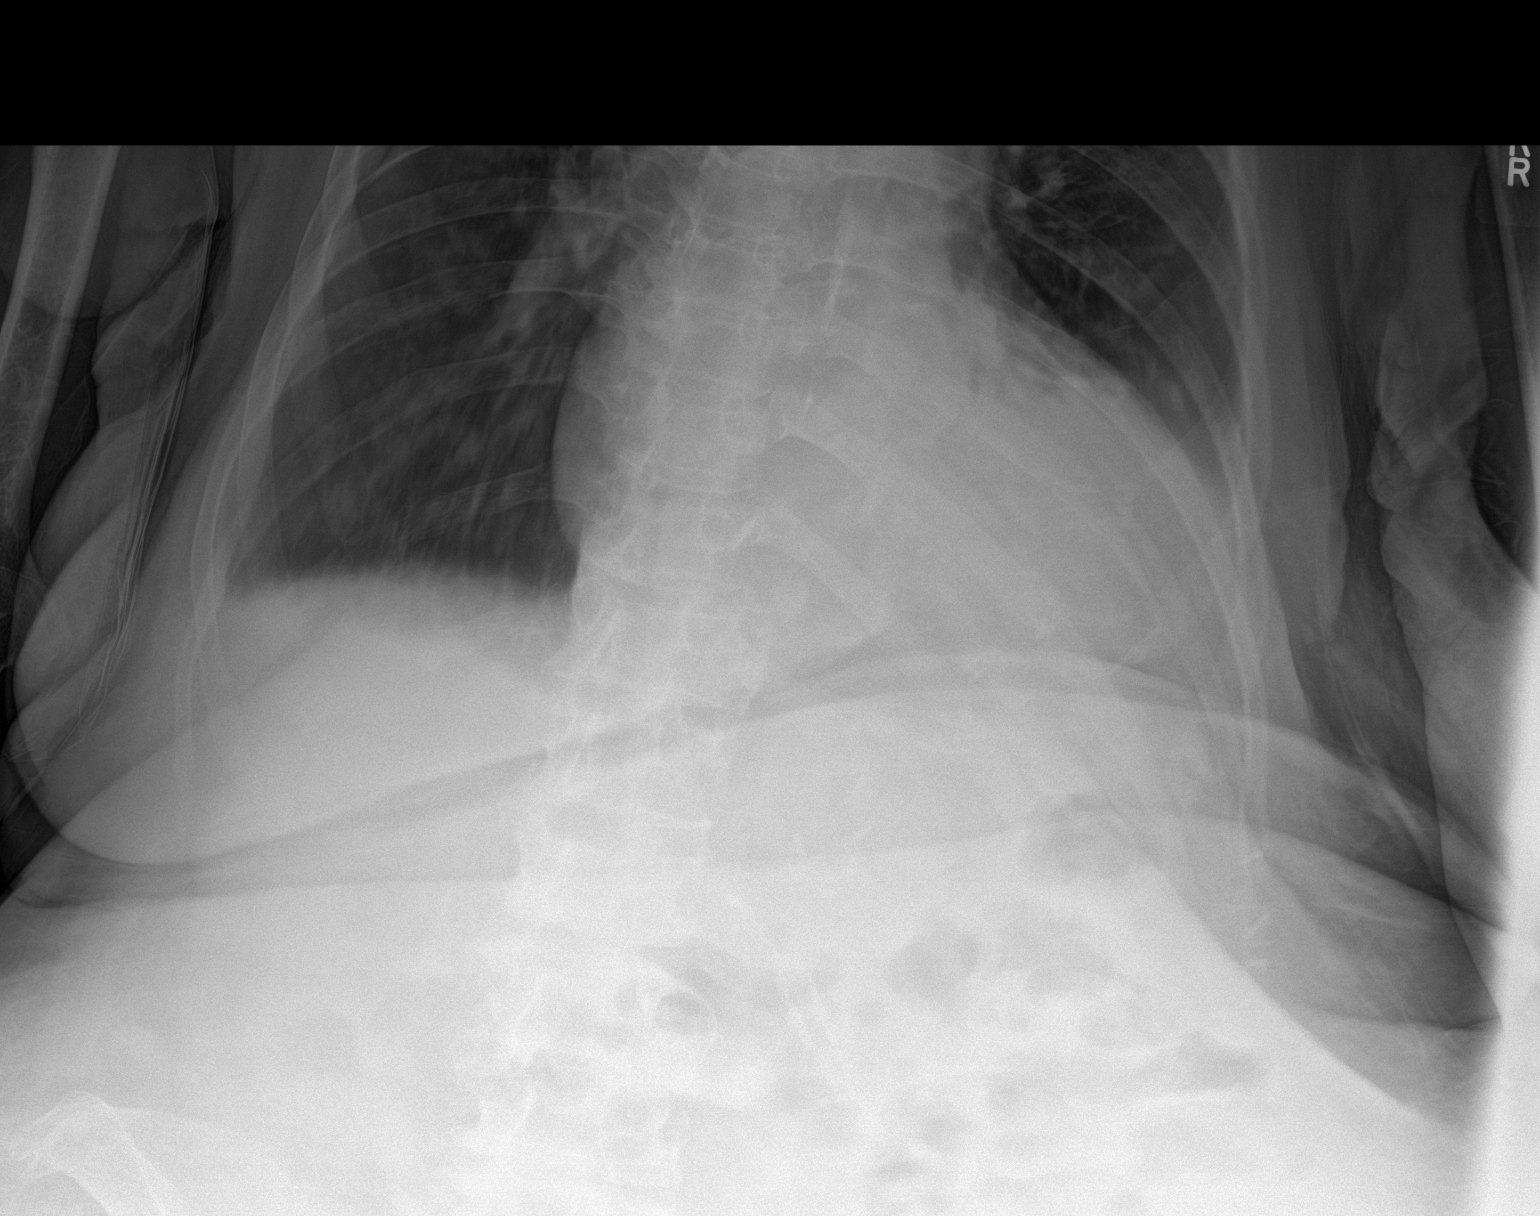
[im 2/2]
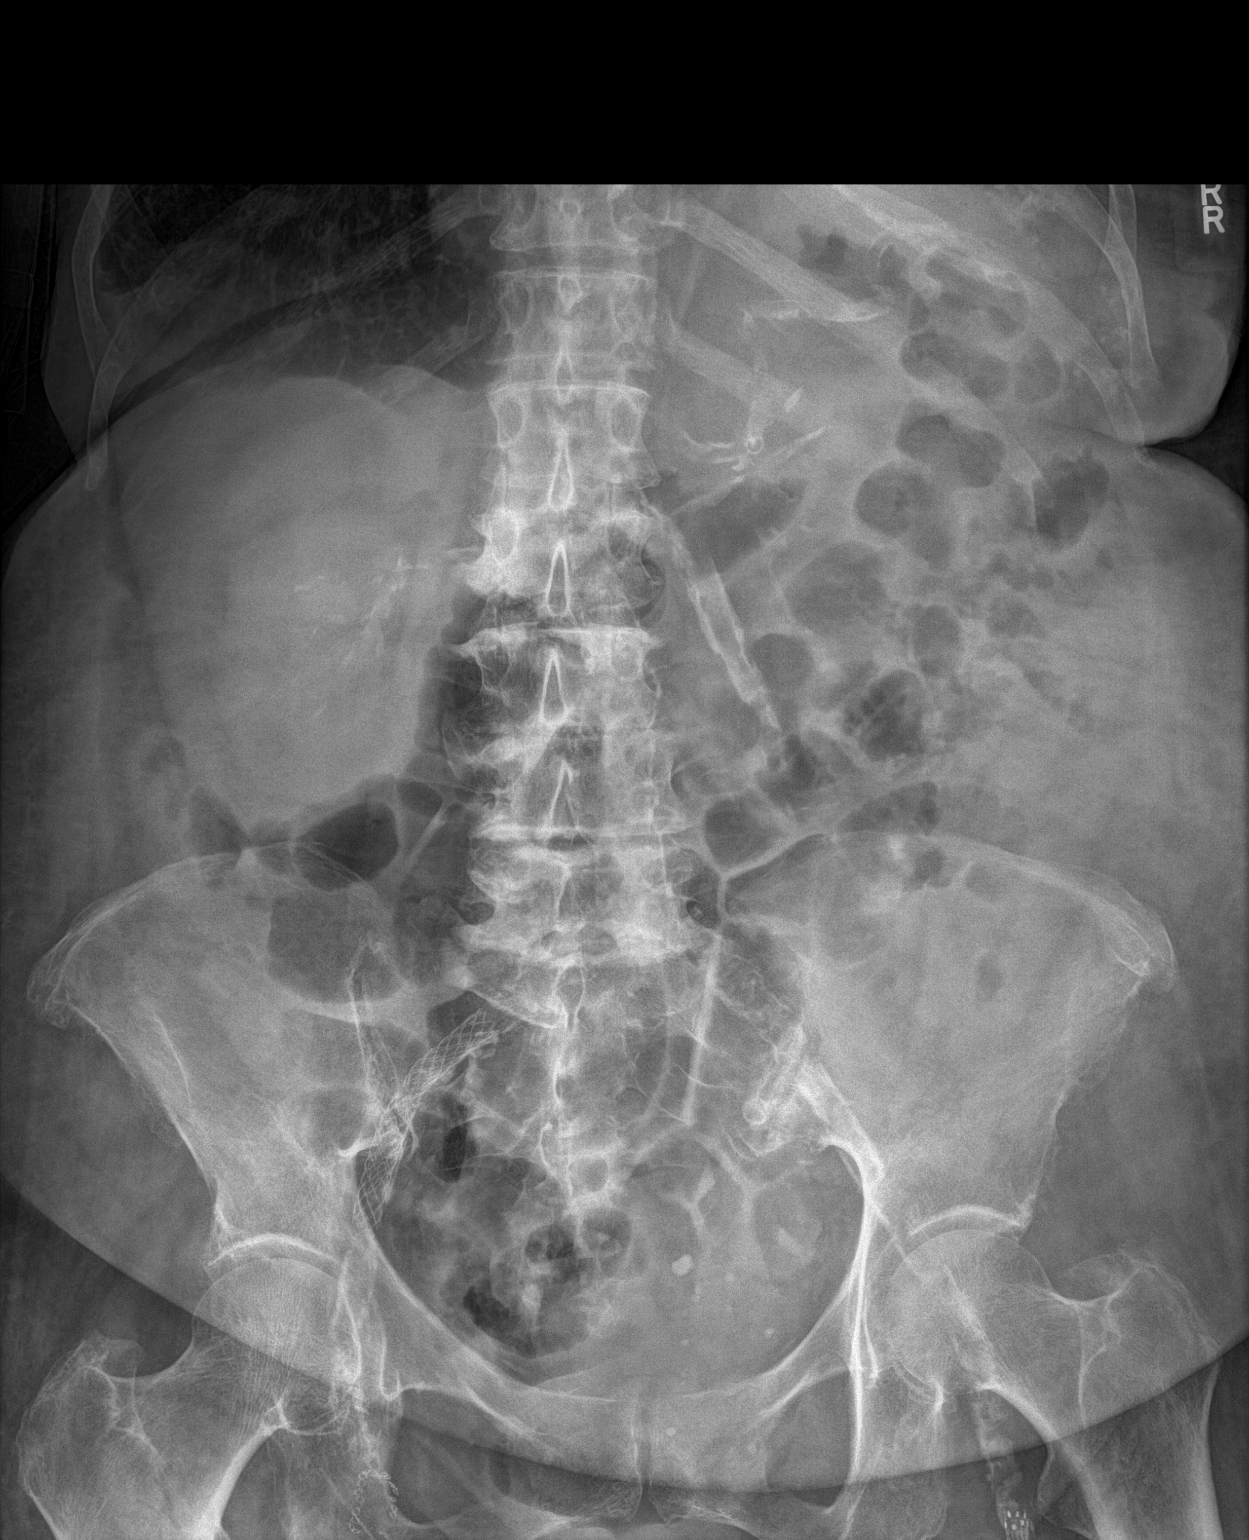

[2 of 2 positions shown; findings below may reference images not displayed]

FINDINGS: Scattered large and small bowel gas is noted. Diffuse vascular
calcifications are seen. Degenerative changes of lumbar spine are
noted. No free air or obstructive changes are noted.
IMPRESSION: No acute abnormality is seen.
